# Patient Record
Sex: Female | Born: 2000
Health system: Southern US, Community
[De-identification: ages and names within clinical notes are randomized; demographics above are authoritative.]

## PROBLEM LIST (undated history)

## (undated) DIAGNOSIS — F419 Anxiety disorder, unspecified: Secondary | ICD-10-CM

## (undated) DIAGNOSIS — F32A Depression, unspecified: Secondary | ICD-10-CM

## (undated) HISTORY — DX: Depression, unspecified: F32.A

## (undated) HISTORY — DX: Anxiety disorder, unspecified: F41.9

## (undated) HISTORY — PX: WISDOM TOOTH EXTRACTION: SHX21

---
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
## 2021-01-04 ENCOUNTER — Ambulatory Visit (INDEPENDENT_AMBULATORY_CARE_PROVIDER_SITE_OTHER): Payer: 59 | Admitting: Certified Nurse Midwife

## 2021-01-04 ENCOUNTER — Other Ambulatory Visit: Payer: Self-pay

## 2021-01-04 ENCOUNTER — Encounter: Payer: Self-pay | Admitting: Certified Nurse Midwife

## 2021-01-04 VITALS — BP 116/75 | HR 57 | Ht 62.0 in | Wt 183.3 lb

## 2021-01-04 DIAGNOSIS — Z3009 Encounter for other general counseling and advice on contraception: Secondary | ICD-10-CM | POA: Diagnosis not present

## 2021-01-04 LAB — POCT URINE PREGNANCY: Preg Test, Ur: NEGATIVE

## 2021-01-04 MED ORDER — NORETHIN ACE-ETH ESTRAD-FE 1-20 MG-MCG PO TABS: 1.0000 | ORAL_TABLET | Freq: Every day | ORAL | 11 refills | Status: DC

## 2021-01-04 NOTE — Progress Notes (Signed)
NP-Present to discuss birth control. Pt stated that she would like OCP for birth control. UPT=neg.

## 2021-01-04 NOTE — Progress Notes (Signed)
Subjective:    Kelly Austin is a 20 y.o. female who presents for contraception counseling. The patient has no complaints today. The patient is sexually active. Pertinent past medical history: none.  Menstrual History: OB History   No obstetric history on file.     UPT is negative today.    The following portions of the patient's history were reviewed and updated as appropriate: allergies, current medications, past family history, past medical history, past social history, past surgical history and problem list.  Review of Systems Pertinent items are noted in HPI.   Objective:    No exam performed today, not indicated for birth control .   Assessment:    20 y.o., starting OCP (estrogen/progesterone), no contraindications.   Plan:    All questions answered.  Reviewed all forms of birth control options available including abstinence; fertility period awareness methods; over the counter/barrier methods; hormonal contraceptive medication including pill, patch, ring, injection,contraceptive implant; hormonal and nonhormonal IUDs; permanent sterilization options including vasectomy and the various tubal sterilization modalities. Risks and benefits reviewed.  Questions were answered.  Information was given to patient to review. She request to use the pill , she has used in the past and has worked well for her. She denies any contraindications to use.   Doreene Burke, CNM

## 2021-01-04 NOTE — Patient Instructions (Signed)

## 2022-04-25 ENCOUNTER — Encounter: Payer: Self-pay | Admitting: Family

## 2022-04-25 ENCOUNTER — Ambulatory Visit: Payer: 59 | Admitting: Family

## 2022-04-25 VITALS — BP 122/74 | HR 49 | Temp 98.6°F | Resp 16 | Ht 62.25 in | Wt 176.0 lb

## 2022-04-25 DIAGNOSIS — F17290 Nicotine dependence, other tobacco product, uncomplicated: Secondary | ICD-10-CM | POA: Insufficient documentation

## 2022-04-25 DIAGNOSIS — G479 Sleep disorder, unspecified: Secondary | ICD-10-CM

## 2022-04-25 DIAGNOSIS — R002 Palpitations: Secondary | ICD-10-CM | POA: Diagnosis not present

## 2022-04-25 DIAGNOSIS — F4310 Post-traumatic stress disorder, unspecified: Secondary | ICD-10-CM | POA: Diagnosis not present

## 2022-04-25 DIAGNOSIS — F410 Panic disorder [episodic paroxysmal anxiety] without agoraphobia: Secondary | ICD-10-CM | POA: Diagnosis not present

## 2022-04-25 DIAGNOSIS — F419 Anxiety disorder, unspecified: Secondary | ICD-10-CM | POA: Diagnosis not present

## 2022-04-25 DIAGNOSIS — J029 Acute pharyngitis, unspecified: Secondary | ICD-10-CM | POA: Diagnosis not present

## 2022-04-25 DIAGNOSIS — R42 Dizziness and giddiness: Secondary | ICD-10-CM | POA: Diagnosis not present

## 2022-04-25 DIAGNOSIS — F32A Depression, unspecified: Secondary | ICD-10-CM | POA: Diagnosis not present

## 2022-04-25 DIAGNOSIS — R5383 Other fatigue: Secondary | ICD-10-CM

## 2022-04-25 LAB — CBC WITH DIFFERENTIAL/PLATELET
Basophils Absolute: 0 10*3/uL (ref 0.0–0.1)
Basophils Relative: 0.4 % (ref 0.0–3.0)
Eosinophils Absolute: 0.1 10*3/uL (ref 0.0–0.7)
Eosinophils Relative: 0.9 % (ref 0.0–5.0)
HCT: 39.2 % (ref 36.0–46.0)
Hemoglobin: 12.9 g/dL (ref 12.0–15.0)
Lymphocytes Relative: 37.2 % (ref 12.0–46.0)
Lymphs Abs: 3 10*3/uL (ref 0.7–4.0)
MCHC: 32.9 g/dL (ref 30.0–36.0)
MCV: 85.6 fl (ref 78.0–100.0)
Monocytes Absolute: 0.7 10*3/uL (ref 0.1–1.0)
Monocytes Relative: 8 % (ref 3.0–12.0)
Neutro Abs: 4.4 10*3/uL (ref 1.4–7.7)
Neutrophils Relative %: 53.5 % (ref 43.0–77.0)
Platelets: 244 10*3/uL (ref 150.0–400.0)
RBC: 4.58 Mil/uL (ref 3.87–5.11)
RDW: 12.9 % (ref 11.5–14.6)
WBC: 8.2 10*3/uL (ref 4.5–10.5)

## 2022-04-25 LAB — LIPID PANEL
Cholesterol: 179 mg/dL (ref 0–200)
HDL: 43.9 mg/dL (ref >39.00)
LDL Cholesterol: 123 mg/dL — ABNORMAL HIGH (ref 0–99)
NonHDL: 135.47
Total CHOL/HDL Ratio: 4
Triglycerides: 64 mg/dL (ref 0.0–149.0)
VLDL: 12.8 mg/dL (ref 0.0–40.0)

## 2022-04-25 LAB — VITAMIN B12: Vitamin B-12: 260 pg/mL (ref 211–911)

## 2022-04-25 LAB — COMPREHENSIVE METABOLIC PANEL
ALT: 22 U/L (ref 0–35)
AST: 22 U/L (ref 0–37)
Albumin: 4.9 g/dL (ref 3.5–5.2)
Alkaline Phosphatase: 62 U/L (ref 39–117)
BUN: 11 mg/dL (ref 6–23)
CO2: 27 mEq/L (ref 19–32)
Calcium: 9.9 mg/dL (ref 8.4–10.5)
Chloride: 104 mEq/L (ref 96–112)
Creatinine, Ser: 0.85 mg/dL (ref 0.40–1.20)
GFR: 98.82 mL/min (ref >60.00)
Glucose, Bld: 94 mg/dL (ref 70–99)
Potassium: 4.4 mEq/L (ref 3.5–5.1)
Sodium: 138 mEq/L (ref 135–145)
Total Bilirubin: 0.3 mg/dL (ref 0.2–1.2)
Total Protein: 7.2 g/dL (ref 6.0–8.3)

## 2022-04-25 LAB — MONONUCLEOSIS SCREEN: Mono Screen: NEGATIVE

## 2022-04-25 LAB — TSH: TSH: 1.28 u[IU]/mL (ref 0.35–5.50)

## 2022-04-25 NOTE — Assessment & Plan Note (Signed)
ekg today, nsr  Will still refer to cardiology Consider holter monitor however pt with daily monster energy drinks, advised likely contributing to palpitations and to try to stop drinking. Limit caffeine as well

## 2022-04-25 NOTE — Progress Notes (Signed)
New Patient Office Visit  Subjective:  Patient ID: Kelly Austin, female    DOB: Aug 30, 2001  Age: 21 y.o. MRN: [id number]  CC:  Chief Complaint  Patient presents with  . Establish Care    HPI Kelly Austin is here to establish care as a new patient.  Prior provider was: Shirlean Kelly, Pediatrician  Pt is with acute concerns.   Over the last few years she does notice that she gets dizzy when she starts to work out. Last time she was exercising her face felt really tingling, she feels flush. This occurs with lifting weights and also cardio. Not as bad at home bc she isn't sure if this is also exacerbated with anxiety. Some slight chest pain. Does feel her heart racing when this occurs. In the moment with sob, more like easy exacerbation with exertion. No known h/o asthma. Does feel at the moment can not get a deep breath.   Also with a sore throat over the last one week.   chronic concerns:  Anxiety depression, PTSD, and panic disorder: tried prozac but had derealization - Does have a therapist, has appt tomorrow as well with the psychiatrist also.    Has also tried zoloft lexapro and cymbalta.   Has been vaping for five years. She is having trouble quitting however she has a lot of anxiety provoking triggers going on that make it hard for her to quit.   Does feel fatigue often. Doesn't sleep overly well, on avreage about five hours of sleep. 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 Anxiety   . Depression     Past Surgical History:  Procedure Laterality Date  . WISDOM TOOTH EXTRACTION      Family History  Problem Relation Age of Onset  . Hypertension Mother   . Alcohol abuse Father   . Cancer Father        unknown, on his hip  . COPD Father   . Healthy Brother   . Heart attack Maternal Grandmother        unsure when, maybe 60's  . Depression Maternal Grandmother   . Dementia  Maternal Grandmother   . Cancer - Lung Paternal Grandfather        smoker  . Healthy Half-Sister     Social History   Socioeconomic History  . Marital status: Single    Spouse name: Not on file  . Number of children: 0  . Years of education: Not on file  . Highest education level: Not on file  Occupational History    Comment: student and work    Comment: Educational psychologist at [employer]  Tobacco Use  . Smoking status: Never  . Smokeless tobacco: Never  Vaping Use  . Vaping Use: Every day  . Start date: 04/12/2017  . Substances: Nicotine, Flavoring  Substance and Sexual Activity  . Alcohol use: No  . Drug use: No  . Sexual activity: Yes    Partners: Male    Birth control/protection: Condom    Comment: in a relationship  Other Topics Concern  . Not on file  Social History Narrative  . Not on file   Social Determinants of Health   Financial Resource Strain: Not on file  Food Insecurity: Not on file  Transportation Needs: Not on file  Physical Activity: Not on file  Stress: Not on file  Social Connections: Not on file  Intimate Partner  Violence: Not on file    Outpatient Medications Prior to Visit  Medication Sig Dispense Refill  . hydrOXYzine (ATARAX) 25 MG tablet     . FLUoxetine (PROZAC) 10 MG tablet Take 10 mg by mouth daily.    . norethindrone-ethinyl estradiol (JUNEL FE 1/20) 1-20 MG-MCG tablet Take 1 tablet by mouth daily. 28 tablet 11  . ondansetron (ZOFRAN ODT) 4 MG disintegrating tablet Take 1 tablet (4 mg total) by mouth every 8 (eight) hours as needed for nausea or vomiting. 9 tablet 0   No facility-administered medications prior to visit.    Allergies  Allergen Reactions  . Cymbalta [Duloxetine Hcl] Other (See Comments)    Increase SI   . Lexapro [Escitalopram] Anxiety    insomnia  . Prozac [Fluoxetine] Anxiety    Derealization   . Zoloft [Sertraline] Nausea Only    Nausea and heartburn and emotional blunting        Objective:    Physical  Exam Vitals reviewed.  Constitutional:      General: She is not in acute distress.    Appearance: Normal appearance. She is obese. She is not ill-appearing, toxic-appearing or diaphoretic.  HENT:     Right Ear: Tympanic membrane normal.     Left Ear: Tympanic membrane normal.     Mouth/Throat:     Mouth: Mucous membranes are moist.     Pharynx: Posterior oropharyngeal erythema (mild) present. No pharyngeal swelling.     Tonsils: No tonsillar exudate.  Eyes:     Extraocular Movements: Extraocular movements intact.     Conjunctiva/sclera: Conjunctivae normal.     Pupils: Pupils are equal, round, and reactive to light.  Neck:     Thyroid: No thyroid mass.  Cardiovascular:     Rate and Rhythm: Normal rate and regular rhythm.  Pulmonary:     Effort: Pulmonary effort is normal.     Breath sounds: Normal breath sounds.  Musculoskeletal:        General: Normal range of motion.  Lymphadenopathy:     Cervical:     Right cervical: No superficial cervical adenopathy.    Left cervical: No superficial cervical adenopathy.  Skin:    General: Skin is warm.     Capillary Refill: Capillary refill takes less than 2 seconds.  Neurological:     General: No focal deficit present.     Mental Status: She is alert and oriented to person, place, and time.  Psychiatric:        Mood and Affect: Mood normal.        Behavior: Behavior normal.        Thought Content: Thought content normal.        Judgment: Judgment normal.      BP 122/74   Pulse (!) 49   Temp 98.6 F (37 C)   Resp 16   Ht 5' 2.25" (1.581 m)   Wt 176 lb (79.8 kg)   LMP 04/05/2022 (Exact Date)   SpO2 98%   BMI 31.93 kg/m  Wt Readings from Last 3 Encounters:  04/25/22 176 lb (79.8 kg)  01/04/21 183 lb 4.8 oz (83.1 kg) (96 %, Z= 1.70)*  02/20/18 150 lb 2.1 oz (68.1 kg) (88 %, Z= 1.16)*   * Growth percentiles are based on CDC (Girls, 2-20 Years) data.     Health Maintenance Due  Topic Date Due  . COVID-19 Vaccine (1)  Never done  . HPV VACCINES (1 - 2-dose series) Never done  . CHLAMYDIA SCREENING  Never done  . HIV Screening  Never done  . Hepatitis C Screening  Never done  . TETANUS/TDAP  Never done       Topic Date Due  . HPV VACCINES (1 - 2-dose series) Never done    Lab Results  Component Value Date   TSH 1.28 04/25/2022   Lab Results  Component Value Date   WBC 8.2 04/25/2022   HGB 12.9 04/25/2022   HCT 39.2 04/25/2022   MCV 85.6 04/25/2022   PLT 244.0 04/25/2022   Lab Results  Component Value Date   NA 138 04/25/2022   K 4.4 04/25/2022   CO2 27 04/25/2022   GLUCOSE 94 04/25/2022   BUN 11 04/25/2022   CREATININE 0.85 04/25/2022   BILITOT 0.3 04/25/2022   ALKPHOS 62 04/25/2022   AST 22 04/25/2022   ALT 22 04/25/2022   PROT 7.2 04/25/2022   ALBUMIN 4.9 04/25/2022   CALCIUM 9.9 04/25/2022   ANIONGAP 7 02/20/2018   GFR 98.82 04/25/2022   Lab Results  Component Value Date   CHOL 179 04/25/2022   Lab Results  Component Value Date   HDL 43.90 04/25/2022   Lab Results  Component Value Date   LDLCALC 123 (H) 04/25/2022   Lab Results  Component Value Date   TRIG 64.0 04/25/2022   Lab Results  Component Value Date   CHOLHDL 4 04/25/2022   No results found for: "HGBA1C"    Assessment & Plan:   Problem List Items Addressed This Visit       Other   Panic attack    hydroxyxine prn       Relevant Medications   hydrOXYzine (ATARAX) 25 MG tablet   Anxiety and depression - Primary    F/u with psychology and psychiatry as scheduled for tomorrow Let's work on managing anxiety to help with possible cessation of vaping.  Could consider wellbutrin with psychiatry if they think this might be helpful option       Relevant Medications   hydrOXYzine (ATARAX) 25 MG tablet   Other Relevant Orders   Comprehensive metabolic panel (Completed)   Lipid panel (Completed)   CBC with Differential/Platelet (Completed)   TSH (Completed)   PTSD (post-traumatic stress  disorder)   Relevant Medications   hydrOXYzine (ATARAX) 25 MG tablet   Sore throat    Strep tested in office, negative Likely more r/t allergies and pnd Warm salt water gargles  Will also order mono testing       Relevant Orders   POCT rapid strep A   Mononucleosis screen (Completed)   Palpitations    ekg today, nsr  Will still refer to cardiology Consider holter monitor however pt with daily monster energy drinks, advised likely contributing to palpitations and to try to stop drinking. Limit caffeine as well      Relevant Orders   EKG 12-Lead (Completed)   Ambulatory referral to Cardiology   Dizziness and giddiness    ekg today in office Increase water intake      Relevant Medications   hydrOXYzine (ATARAX) 25 MG tablet   Other Relevant Orders   EKG 12-Lead (Completed)   Ambulatory referral to Cardiology   Other fatigue    Fatigue lab work up ordered and pending      Relevant Orders   Comprehensive metabolic panel (Completed)   Lipid panel (Completed)   CBC with Differential/Platelet (Completed)   TSH (Completed)   Vitamin B12 (Completed)   Ambulatory referral to Cardiology   Sleep  disorder    Work on sleep techniques, handout sent to [employer]       Relevant Orders   Comprehensive metabolic panel (Completed)   Lipid panel (Completed)   CBC with Differential/Platelet (Completed)   TSH (Completed)   Vaping nicotine dependence, tobacco product    Discussed cessation options nicorette gum /nicotine patch  Also recommended pt get control of anxiety/depression with psychiatry and psychology as this may also help        No orders of the defined types were placed in this encounter.   Follow-up: Return in about 3 months (around 07/26/2022) for regular follow up .    Mort Sawyers, FNP

## 2022-04-25 NOTE — Assessment & Plan Note (Signed)
Fatigue lab work up ordered and pending

## 2022-04-25 NOTE — Assessment & Plan Note (Signed)
hydroxyxine prn

## 2022-04-25 NOTE — Assessment & Plan Note (Signed)
ekg today in office Increase water intake

## 2022-04-25 NOTE — Assessment & Plan Note (Addendum)
Strep tested in office, negative Likely more r/t allergies and pnd Warm salt water gargles  Will also order mono testing

## 2022-04-25 NOTE — Assessment & Plan Note (Signed)
F/u with psychology and psychiatry as scheduled for tomorrow Let's work on managing anxiety to help with possible cessation of vaping.  Could consider wellbutrin with psychiatry if they think this might be helpful option

## 2022-04-25 NOTE — Assessment & Plan Note (Signed)
Work on sleep techniques, handout sent to [employer]

## 2022-04-25 NOTE — Patient Instructions (Addendum)
Welcome to our clinic, I am happy to have you as my new patient. I am excited to continue on this healthcare journey with you.  Start taking zyrtec over the counter.   Recommend stopping energy drinks as this may be causing your symptoms.   A referral was placed today for cardiology Please let us know if you have not heard back within 2 weeks about the referral.  Stop by the lab prior to leaving today. I will notify you of your results once received.   Please keep in mind Any my chart messages you send have up to a three business day turnaround for a response.  Phone calls may take up to a one full business day turnaround for a  response.   If you need a medication refill I recommend you request it through the pharmacy as this is easiest for [place] rather than sending a message and or phone call.   Due to recent changes in healthcare laws, you may see results of your imaging and/or laboratory studies on MyChart before I have had a chance to review them.  I understand that in some cases there may be results that are confusing or concerning to you. Please understand that not all results are received at the same time and often I may need to interpret multiple results in order to provide you with the best plan of care or course of treatment. Therefore, I ask that you please give me 2 business days to thoroughly review all your results before contacting my office for clarification. Should we see a critical lab result, you will be contacted sooner.   It was a pleasure seeing you today! Please do not hesitate to reach out with any questions and or concerns.  Regards,   Mort Sawyers FNP-C

## 2022-04-25 NOTE — Assessment & Plan Note (Signed)
Discussed cessation options nicorette gum /nicotine patch  Also recommended pt get control of anxiety/depression with psychiatry and psychology as this may also help

## 2022-06-28 ENCOUNTER — Ambulatory Visit: Payer: 59 | Attending: Cardiovascular Disease | Admitting: Cardiovascular Disease

## 2022-06-28 ENCOUNTER — Encounter: Payer: Self-pay | Admitting: Cardiovascular Disease

## 2022-06-28 VITALS — BP 119/76 | HR 83 | Ht 62.0 in | Wt 177.4 lb

## 2022-06-28 DIAGNOSIS — R42 Dizziness and giddiness: Secondary | ICD-10-CM | POA: Diagnosis not present

## 2022-06-28 DIAGNOSIS — R079 Chest pain, unspecified: Secondary | ICD-10-CM

## 2022-06-28 DIAGNOSIS — R0602 Shortness of breath: Secondary | ICD-10-CM | POA: Diagnosis not present

## 2022-06-28 NOTE — Progress Notes (Signed)
Cardiology Office Note   Date:  06/28/2022   ID:  Kelly Austin, DOB Jun 13, 2001, MRN [id number]  PCP:  Mort Sawyers, FNP  Cardiologist:   Lorine Bears, MD   Chief Complaint  Patient presents with   Other    Dizziness. Meds reviewed verbally with pt.      History of Present Illness: Kelly Austin is a 21 y.o. female who was referred by Mort Sawyers for evaluation of dizziness.  The patient was told that she was born with a heart murmur which has subsequently resolved.  She has history of anxiety and no other medical conditions.  She started working out recently and had episodes of dizziness mainly with weight lifting.  She did not have the symptoms with aerobic exercises.  She does report intermittent chest pain and shortness of breath.  No syncope or presyncope.  She used to vape but she stopped recently.  She does not smoke cigarettes.  No alcohol or drug use. There is no family history of premature coronary artery disease or arrhythmia.    Past Medical History:  Diagnosis Date   Anxiety    Depression     Past Surgical History:  Procedure Laterality Date   WISDOM TOOTH EXTRACTION       Current Outpatient Medications  Medication Sig Dispense Refill   cyanocobalamin (VITAMIN B12) 1000 MCG tablet Take 1,000 mcg by mouth daily.     hydrOXYzine (ATARAX) 25 MG tablet Take 25 mg by mouth as needed.     No current facility-administered medications for this visit.    Allergies:   Cymbalta [duloxetine hcl], Lexapro [escitalopram], Prozac [fluoxetine], and Zoloft [sertraline]    Social History:  The patient  reports that she has never smoked. She has never used smokeless tobacco. She reports that she does not drink alcohol and does not use drugs.   Family History:  The patient's family history includes Alcohol abuse in her father; COPD in her father; Cancer in her father; Cancer - Lung in her paternal grandfather; Dementia in her maternal grandmother;  Depression in her maternal grandmother; Healthy in her brother and half-sister; Heart attack in her maternal grandmother; Hypertension in her mother.    ROS:  Please see the history of present illness.   Otherwise, review of systems are positive for none.   All other systems are reviewed and negative.    PHYSICAL EXAM: VS:  BP 119/76 (BP Location: Right Arm, Patient Position: Sitting, Cuff Size: Large)   Pulse 83   Ht 5\' 2"  (1.575 m)   Wt 177 lb 6 oz (80.5 kg)   SpO2 93%   BMI 32.44 kg/m  , BMI Body mass index is 32.44 kg/m. GEN: Well nourished, well developed, in no acute distress  HEENT: normal  Neck: no JVD, carotid bruits, or masses Cardiac: RRR; no murmurs, rubs, or gallops,no edema  Respiratory:  clear to auscultation bilaterally, normal work of breathing GI: soft, nontender, nondistended, + BS MS: no deformity or atrophy  Skin: warm and dry, no rash Neuro:  Strength and sensation are intact Psych: euthymic mood, full affect   EKG:  EKG is ordered today. The ekg ordered today demonstrates normal sinus rhythm with nonspecific T wave changes.   Recent Labs: 04/25/2022: ALT 22; BUN 11; Creatinine, Ser 0.85; Hemoglobin 12.9; Platelets 244.0; Potassium 4.4; Sodium 138; TSH 1.28    Lipid Panel    Component Value Date/Time   CHOL 179 04/25/2022 1131   TRIG 64.0 04/25/2022 1131  HDL 43.90 04/25/2022 1131   CHOLHDL 4 04/25/2022 1131   VLDL 12.8 04/25/2022 1131   LDLCALC 123 (H) 04/25/2022 1131      Wt Readings from Last 3 Encounters:  06/28/22 177 lb 6 oz (80.5 kg)  04/25/22 176 lb (79.8 kg)  01/04/21 183 lb 4.8 oz (83.1 kg) (96 %, Z= 1.70)*   * Growth percentiles are based on CDC (Girls, 2-20 Years) data.          06/28/2022    2:16 PM  PAD Screen  Previous PAD dx? No  Previous surgical procedure? No  Pain with walking? No  Feet/toe relief with dangling? No  Painful, non-healing ulcers? No  Extremities discolored? No      ASSESSMENT AND PLAN:  1.   Dizziness: It was mainly with weightlifting exercises but not with aerobic exercises.  Likely a vagal reaction.  No strong evidence of arrhythmia.  She is not orthostatic today and no significant change in heart rate with change in position.  Thus, no evidence of POTS.  She was reassured. Given her symptoms of intermittent chest pain, shortness of breath and dizziness with exercise, I requested an echocardiogram to ensure no structural heart abnormalities especially with her reported history of heart murmur when she was born.  Otherwise, I do not recommend further work-up.    Disposition:   FU with me as needed  Signed,  Kathlyn Sacramento, MD  06/28/2022 2:29 PM    [place]

## 2022-06-28 NOTE — Patient Instructions (Addendum)
Medication Instructions:  Your physician recommends that you continue on your current medications as directed. Please refer to the Current Medication list given to you today.  *If you need a refill on your cardiac medications before your next appointment, please call your pharmacy*   Lab Work: None ordered If you have labs (blood work) drawn today and your tests are completely normal, you will receive your results only by: MyChart Message (if you have MyChart) OR A paper copy in the mail If you have any lab test that is abnormal or we need to change your treatment, we will call you to review the results.   Testing/Procedures: Your physician has requested that you have an echocardiogram. Echocardiography is a painless test that uses sound waves to create images of your heart. It provides your doctor with information about the size and shape of your heart and how well your heart's chambers and valves are working. This procedure takes approximately one hour. There are no restrictions for this procedure.    Follow-Up: At [facility], you and your health needs are our priority.  As part of our continuing mission to provide you with exceptional heart care, we have created designated Provider Care Teams.  These Care Teams include your primary Cardiologist (physician) and Advanced Practice Providers (APPs -  Physician Assistants and Nurse Practitioners) who all work together to provide you with the care you need, when you need it.  We recommend signing up for the patient portal called "MyChart".  Sign up information is provided on this After Visit Summary.  MyChart is used to connect with patients for Virtual Visits (Telemedicine).  Patients are able to view lab/test results, encounter notes, upcoming appointments, etc.  Non-urgent messages can be sent to your provider as well.   To learn more about what you can do with MyChart, go to [email or web address].    Your next appointment:    As needed  The format for your next appointment:   In Person  Provider:   You may see Lorine Bears, MD or one of the following Advanced Practice Providers on your designated Care Team:   Nicolasa Ducking, NP Eula Listen, PA-C Cadence Fransico Michael, PA-C Charlsie Quest, NP   Other Instructions  Echocardiogram An echocardiogram is a test that uses sound waves (ultrasound) to produce images of the heart. Images from an echocardiogram can provide important information about: Heart size and shape. The size and thickness and movement of your heart's walls. Heart muscle function and strength. Heart valve function or if you have stenosis. Stenosis is when the heart valves are too narrow. If blood is flowing backward through the heart valves (regurgitation). A tumor or infectious growth around the heart valves. Areas of heart muscle that are not working well because of poor blood flow or injury from a heart attack. Aneurysm detection. An aneurysm is a weak or damaged part of an artery wall. The wall bulges out from the normal force of blood pumping through the body. Tell a health care provider about: Any allergies you have. All medicines you are taking, including vitamins, herbs, eye drops, creams, and over-the-counter medicines. Any blood disorders you have. Any surgeries you have had. Any medical conditions you have. Whether you are pregnant or may be pregnant. What are the risks? Generally, this is a safe test. However, problems may occur, including an allergic reaction to dye (contrast) that may be used during the test. What happens before the test? No specific preparation is needed. You may eat  and drink normally. What happens during the test?  You will take off your clothes from the waist up and put on a hospital gown. Electrodes or electrocardiogram (ECG)patches may be placed on your chest. The electrodes or patches are then connected to a device that monitors your heart rate and  rhythm. You will lie down on a table for an ultrasound exam. A gel will be applied to your chest to help sound waves pass through your skin. A handheld device, called a transducer, will be pressed against your chest and moved over your heart. The transducer produces sound waves that travel to your heart and bounce back (or "echo" back) to the transducer. These sound waves will be captured in real-time and changed into images of your heart that can be viewed on a video monitor. The images will be recorded on a computer and reviewed by your health care provider. You may be asked to change positions or hold your breath for a short time. This makes it easier to get different views or better views of your heart. In some cases, you may receive a image enhancer through an IV in one of your veins. This can improve the quality of the pictures from your heart. The procedure may vary among health care providers and hospitals. What can I expect after the test? You may return to your normal, everyday life, including diet, activities, and medicines, unless your health care provider tells you not to do that. Follow these instructions at home: It is up to you to get the results of your test. Ask your health care provider, or the department that is doing the test, when your results will be ready. Keep all follow-up visits. This is important. Summary An echocardiogram is a test that uses sound waves (ultrasound) to produce images of the heart. Images from an echocardiogram can provide important information about the size and shape of your heart, heart muscle function, heart valve function, and other possible heart problems. You do not need to do anything to prepare before this test. You may eat and drink normally. After the echocardiogram is completed, you may return to your normal, everyday life, unless your health care provider tells you not to do that. This information is not intended to replace advice given to you  by your health care provider. Make sure you discuss any questions you have with your health care provider. Document Revised: 06/02/2021 Document Reviewed: 05/12/2020 Elsevier Patient Education  [place].

## 2022-07-29 ENCOUNTER — Ambulatory Visit: Payer: 59 | Admitting: Family

## 2022-08-03 ENCOUNTER — Ambulatory Visit: Payer: 59 | Attending: Cardiovascular Disease

## 2022-08-03 DIAGNOSIS — R0602 Shortness of breath: Secondary | ICD-10-CM

## 2022-08-03 DIAGNOSIS — R079 Chest pain, unspecified: Secondary | ICD-10-CM

## 2022-08-03 LAB — ECHOCARDIOGRAM COMPLETE
AR max vel: 3.33 cm2
AV Area VTI: 2.97 cm2
AV Area mean vel: 2.96 cm2
AV Mean grad: 3 mmHg
AV Peak grad: 5.6 mmHg
Ao pk vel: 1.18 m/s
Area-P 1/2: 3.74 cm2
Calc EF: 73.4 %
S' Lateral: 2.8 cm
Single Plane A2C EF: 77.3 %
Single Plane A4C EF: 70.9 %

## 2022-08-12 ENCOUNTER — Ambulatory Visit: Payer: 59 | Admitting: Family

## 2023-10-23 ENCOUNTER — Ambulatory Visit: Payer: 59 | Admitting: Family

## 2023-10-23 VITALS — BP 142/80 | HR 83 | Temp 97.5°F | Ht 62.0 in | Wt 224.2 lb

## 2023-10-23 DIAGNOSIS — R309 Painful micturition, unspecified: Secondary | ICD-10-CM

## 2023-10-23 DIAGNOSIS — R350 Frequency of micturition: Secondary | ICD-10-CM

## 2023-10-23 DIAGNOSIS — N3000 Acute cystitis without hematuria: Secondary | ICD-10-CM | POA: Diagnosis not present

## 2023-10-23 LAB — POCT URINE DIPSTICK
Bilirubin, UA: NEGATIVE
Blood, UA: NEGATIVE
Glucose, UA: 100 mg/dL — AB
Ketones, POC UA: NEGATIVE mg/dL
Nitrite, UA: POSITIVE — AB
Spec Grav, UA: 1.01 (ref 1.010–1.025)
Urobilinogen, UA: 1 U/dL — AB
pH, UA: 8.5 — AB (ref 5.0–8.0)

## 2023-10-23 MED ORDER — NITROFURANTOIN MONOHYD MACRO 100 MG PO CAPS: 100.0000 mg | ORAL_CAPSULE | Freq: Two times a day (BID) | ORAL | 0 refills | Status: AC

## 2023-10-23 NOTE — Progress Notes (Unsigned)
   Established Patient Office Visit  Subjective:   Patient ID: Kelly Austin, female    DOB: 20-May-2001  Age: 23 y.o. MRN: [id number]  CC:  Chief Complaint  Patient presents with   Urinary Tract Infection    HPI: Kelly Austin is a 23 y.o. female presenting on 10/23/2023 for Urinary Tract Infection  A few weeks ago started with dysuria, frequency and pelvic discomfort. No back pain. No fever or chills.   No vaginal discharge.  No chance of STD  Has increased her water intake.   No constipation or diarrhea.         ROS: Negative unless specifically indicated above in HPI.   Relevant past medical history reviewed and updated as indicated.   Allergies and medications reviewed and updated.   Current Outpatient Medications:    cyanocobalamin (VITAMIN B12) 1000 MCG tablet, Take 1,000 mcg by mouth daily., Disp: , Rfl:    hydrOXYzine (ATARAX) 25 MG tablet, Take 25 mg by mouth as needed., Disp: , Rfl:   Allergies  Allergen Reactions   Cymbalta [Duloxetine Hcl] Other (See Comments)    Increase SI    Lexapro [Escitalopram] Anxiety    insomnia   Prozac [Fluoxetine] Anxiety    Derealization    Zoloft [Sertraline] Nausea Only    Nausea and heartburn and emotional blunting    Objective:   BP (!) 142/80 (BP Location: Left Arm, Patient Position: Sitting, Cuff Size: Large)   Pulse 83   Temp (!) 97.5 F (36.4 C) (Temporal)   Ht 5\' 2"  (1.575 m)   Wt 224 lb 3.2 oz (101.7 kg)   SpO2 95%   BMI 41.01 kg/m    Physical Exam  Assessment & Plan:  Urinary frequency -     POCT URINE DIPSTICK -     Urinalysis w microscopic + reflex cultur  Pain with urination -     POCT URINE DIPSTICK -     Urinalysis w microscopic + reflex cultur     Follow up plan: No follow-ups on file.  Mort Sawyers, FNP

## 2023-10-24 ENCOUNTER — Encounter: Payer: Self-pay | Admitting: Family

## 2023-10-24 DIAGNOSIS — N3000 Acute cystitis without hematuria: Secondary | ICD-10-CM | POA: Insufficient documentation

## 2023-10-24 NOTE — Assessment & Plan Note (Signed)
poct urine dip in office Urine culture ordered pending results antbx sent to pharmacy, pt to take as directed. Encouraged increased water intake throughout the day. Choosing to treat due to being symptomatic. If no improvement in the next 2 days pt advised to let me know.  rx nitrofurantoin 100 mg po bix x 7 days  

## 2023-10-25 LAB — URINALYSIS W MICROSCOPIC + REFLEX CULTURE
Bilirubin Urine: NEGATIVE
Glucose, UA: NEGATIVE
Hgb urine dipstick: NEGATIVE
Hyaline Cast: NONE SEEN /LPF
Ketones, ur: NEGATIVE
Leukocyte Esterase: NEGATIVE
Nitrites, Initial: POSITIVE — AB
Protein, ur: NEGATIVE
RBC / HPF: NONE SEEN /HPF (ref 0–2)
Specific Gravity, Urine: 1.018 (ref 1.001–1.035)
pH: 8.5 — ABNORMAL HIGH (ref 5.0–8.0)

## 2023-10-25 LAB — URINE CULTURE
MICRO NUMBER:: 15980246
SPECIMEN QUALITY:: ADEQUATE

## 2023-10-25 LAB — CULTURE INDICATED

## 2024-04-24 ENCOUNTER — Ambulatory Visit: Attending: Family

## 2024-04-24 ENCOUNTER — Ambulatory Visit: Admitting: Family

## 2024-04-24 ENCOUNTER — Encounter: Payer: Self-pay | Admitting: Family

## 2024-04-24 ENCOUNTER — Ambulatory Visit: Payer: Self-pay | Admitting: Family

## 2024-04-24 VITALS — BP 114/68 | HR 60 | Temp 98.3°F | Ht 62.0 in | Wt 226.4 lb

## 2024-04-24 DIAGNOSIS — R82998 Other abnormal findings in urine: Secondary | ICD-10-CM

## 2024-04-24 DIAGNOSIS — Z6841 Body Mass Index (BMI) 40.0 and over, adult: Secondary | ICD-10-CM | POA: Insufficient documentation

## 2024-04-24 DIAGNOSIS — R12 Heartburn: Secondary | ICD-10-CM

## 2024-04-24 DIAGNOSIS — N83202 Unspecified ovarian cyst, left side: Secondary | ICD-10-CM

## 2024-04-24 DIAGNOSIS — E538 Deficiency of other specified B group vitamins: Secondary | ICD-10-CM

## 2024-04-24 DIAGNOSIS — R001 Bradycardia, unspecified: Secondary | ICD-10-CM

## 2024-04-24 DIAGNOSIS — R1013 Epigastric pain: Secondary | ICD-10-CM

## 2024-04-24 DIAGNOSIS — R110 Nausea: Secondary | ICD-10-CM

## 2024-04-24 DIAGNOSIS — R0683 Snoring: Secondary | ICD-10-CM | POA: Diagnosis not present

## 2024-04-24 DIAGNOSIS — R1010 Upper abdominal pain, unspecified: Secondary | ICD-10-CM | POA: Diagnosis not present

## 2024-04-24 DIAGNOSIS — R300 Dysuria: Secondary | ICD-10-CM

## 2024-04-24 DIAGNOSIS — R1032 Left lower quadrant pain: Secondary | ICD-10-CM

## 2024-04-24 DIAGNOSIS — G4719 Other hypersomnia: Secondary | ICD-10-CM

## 2024-04-24 DIAGNOSIS — Q51810 Arcuate uterus: Secondary | ICD-10-CM

## 2024-04-24 LAB — CBC
HCT: 37 % (ref 36.0–46.0)
Hemoglobin: 12 g/dL (ref 12.0–15.0)
MCHC: 32.4 g/dL (ref 30.0–36.0)
MCV: 80.8 fl (ref 78.0–100.0)
Platelets: 292 K/uL (ref 150.0–400.0)
RBC: 4.58 Mil/uL (ref 3.87–5.11)
RDW: 14.2 % (ref 11.5–15.5)
WBC: 10.1 K/uL (ref 4.0–10.5)

## 2024-04-24 LAB — COMPREHENSIVE METABOLIC PANEL WITH GFR
ALT: 21 U/L (ref 0–35)
AST: 25 U/L (ref 0–37)
Albumin: 4.3 g/dL (ref 3.5–5.2)
Alkaline Phosphatase: 87 U/L (ref 39–117)
BUN: 13 mg/dL (ref 6–23)
CO2: 25 meq/L (ref 19–32)
Calcium: 9.6 mg/dL (ref 8.4–10.5)
Chloride: 106 meq/L (ref 96–112)
Creatinine, Ser: 0.84 mg/dL (ref 0.40–1.20)
GFR: 98.84 mL/min (ref >60.00)
Glucose, Bld: 94 mg/dL (ref 70–99)
Potassium: 4.9 meq/L (ref 3.5–5.1)
Sodium: 137 meq/L (ref 135–145)
Total Bilirubin: 0.4 mg/dL (ref 0.2–1.2)
Total Protein: 6.9 g/dL (ref 6.0–8.3)

## 2024-04-24 LAB — POCT URINE PREGNANCY: Preg Test, Ur: NEGATIVE

## 2024-04-24 LAB — T4, FREE: Free T4: 0.92 ng/dL (ref 0.60–1.60)

## 2024-04-24 LAB — T3, FREE: T3, Free: 3.5 pg/mL (ref 2.3–4.2)

## 2024-04-24 LAB — TSH: TSH: 0.93 u[IU]/mL (ref 0.35–5.50)

## 2024-04-24 LAB — VITAMIN B12: Vitamin B-12: 278 pg/mL (ref 211–911)

## 2024-04-24 MED ORDER — OMEPRAZOLE 20 MG PO CPDR: 20.0000 mg | DELAYED_RELEASE_CAPSULE | Freq: Every day | ORAL | 0 refills | Status: DC

## 2024-04-24 NOTE — Assessment & Plan Note (Addendum)
 Pt has upcoming period suspect left ovarian cyst as she is days from onset of period however will monitor, if persistent will order transvaginal u/s

## 2024-04-24 NOTE — Assessment & Plan Note (Signed)
 Ongoing with snoring some symptoms of apnea.  Referral for sleep study to r/o OSA  pending

## 2024-04-24 NOTE — Assessment & Plan Note (Signed)
 Trial omeprazole  20 mg x 2 weeks Try to decrease and or avoid spicy foods, fried fatty foods, and also caffeine and chocolate as these can increase heartburn symptoms.  H pylori and alpha gal ordered to r/o, pending

## 2024-04-24 NOTE — Assessment & Plan Note (Signed)
 Pt advised to work on diet and exercise as tolerated

## 2024-04-24 NOTE — Progress Notes (Signed)
 noted

## 2024-04-24 NOTE — Assessment & Plan Note (Signed)
 EKG in office rate 67 bpm  Sinus bradycardia  Zio monitor ordered Asymptomatic  Pending results.

## 2024-04-24 NOTE — Progress Notes (Signed)
 Established Patient Office Visit  Subjective:   Patient ID: ALEENAH HOMEN, female    DOB: Mar 16, 2001  Age: 23 y.o. MRN: [id number]  CC:  Chief Complaint  Patient presents with   Medical Management of Chronic Issues    HPI: AMAYIAH GOSNELL is a 23 y.o. female presenting on 04/24/2024 for Medical Management of Chronic Issues  For the last few months she has pain after eating food. She has noticed anything that has a lot of gluten or unhealthy things there will be increased pain. She hasn't noticed any change in bowel movements. The pain is typically the upper abdomen and will burn at times, with at times heartburn. She might have some issues with beef but she hasn't noticed 100%. No blood in stool. Nausea but not throwing up.   Pain only after eating typically most time with an empty stomach does not hurt but she will be nauseous especially in the am. No change of pregnancy. LMP June 30th.   Has noticed on her apple watch her resting heart rate is around 40/50 bpm. She does feel a lot of fatigue. No chest pain, no increased sob or doe. Does not feel like he heart skips a beat. She does snore at time, she is tried throughout the day, has not noticed headaches in the am.          ROS: Negative unless specifically indicated above in HPI.   Relevant past medical history reviewed and updated as indicated.   Allergies and medications reviewed and updated.   Current Outpatient Medications:    hydrOXYzine (ATARAX) 25 MG tablet, Take 25 mg by mouth as needed., Disp: , Rfl:    omeprazole  (PRILOSEC) 20 MG capsule, Take 1 capsule (20 mg total) by mouth daily., Disp: 30 capsule, Rfl: 0  Allergies  Allergen Reactions   Cymbalta [Duloxetine Hcl] Other (See Comments)    Increase SI    Lexapro [Escitalopram] Anxiety    insomnia   Prozac [Fluoxetine] Anxiety    Derealization    Zoloft [Sertraline] Nausea Only    Nausea and heartburn and emotional blunting    Objective:   BP  114/68 (BP Location: Right Arm, Patient Position: Sitting, Cuff Size: Large)   Pulse 60 Comment: manual  Temp 98.3 F (36.8 C) (Temporal)   Ht 5' 2 (1.575 m)   Wt 226 lb 6.4 oz (102.7 kg)   LMP 04/01/2024 (Exact Date)   SpO2 100%   BMI 41.41 kg/m    Physical Exam Vitals reviewed.  Constitutional:      General: She is not in acute distress.    Appearance: Normal appearance. She is obese. She is not ill-appearing, toxic-appearing or diaphoretic.  HENT:     Head: Normocephalic.  Cardiovascular:     Rate and Rhythm: Bradycardia present.  Pulmonary:     Effort: Pulmonary effort is normal.     Breath sounds: Normal breath sounds.  Abdominal:     General: Abdomen is flat. Bowel sounds are normal.     Palpations: Abdomen is soft.     Tenderness: There is abdominal tenderness in the epigastric area and left lower quadrant. There is no guarding or rebound.  Musculoskeletal:        General: Normal range of motion.     Right lower leg: No edema.     Left lower leg: No edema.  Neurological:     General: No focal deficit present.     Mental Status: She is alert and oriented  to person, place, and time. Mental status is at baseline.  Psychiatric:        Mood and Affect: Mood normal.        Behavior: Behavior normal.        Thought Content: Thought content normal.        Judgment: Judgment normal.     Assessment & Plan:  Heartburn Assessment & Plan: Trial omeprazole  20 mg x 2 weeks Try to decrease and or avoid spicy foods, fried fatty foods, and also caffeine and chocolate as these can increase heartburn symptoms.  H pylori and alpha gal ordered to r/o, pending  Orders: -     Omeprazole ; Take 1 capsule (20 mg total) by mouth daily.  Dispense: 30 capsule; Refill: 0 -     Alpha-Gal Panel -     CBC -     Comprehensive metabolic panel with GFR  Snoring -     Ambulatory referral to Sleep Studies  Excessive daytime sleepiness Assessment & Plan: Ongoing with snoring some symptoms  of apnea.  Referral for sleep study to r/o OSA  pending  Orders: -     Ambulatory referral to Sleep Studies -     TSH -     T3, free -     T4, free  BMI 40.0-44.9, adult (HCC) Assessment & Plan: Pt advised to work on diet and exercise as tolerated   Orders: -     EKG 12-Lead  Epigastric pain -     Alpha-Gal Panel -     CBC -     Comprehensive metabolic panel with GFR  Upper abdominal pain -     Comprehensive metabolic panel with GFR  Bradycardia -     TSH -     T3, free -     T4, free  Dysuria -     Urinalysis w microscopic + reflex cultur  Nausea -     POCT urine pregnancy -     Urinalysis w microscopic + reflex cultur  Left lower quadrant abdominal pain Assessment & Plan: Pt has upcoming period suspect left ovarian cyst as she is days from onset of period however will monitor, if persistent will order transvaginal u/s   Arcuate uterus Assessment & Plan: arcuate uterus configuration incidentally seen on CT abd pelvis  Congential  Can have issues with pregnancy   Left ovarian cyst  Sinus bradycardia Assessment & Plan: EKG in office rate 67 bpm  Sinus bradycardia  Zio monitor ordered Asymptomatic  Pending results.    Orders: -     LONG TERM MONITOR (3-14 DAYS); Future  Vitamin B12 deficiency -     Vitamin B12  Other orders -     Interpretation: -     Urine Culture -     REFLEXIVE URINE CULTURE     Follow up plan: Return in about 6 months (around 10/25/2024) for f/u CPE.  Ginger Patrick, FNP

## 2024-04-24 NOTE — Assessment & Plan Note (Signed)
 arcuate uterus configuration incidentally seen on CT abd pelvis  Congential  Can have issues with pregnancy

## 2024-04-28 LAB — URINE CULTURE
MICRO NUMBER:: 16737365
SPECIMEN QUALITY:: ADEQUATE

## 2024-04-28 LAB — URINALYSIS W MICROSCOPIC + REFLEX CULTURE
Bacteria, UA: NONE SEEN /HPF
Bilirubin Urine: NEGATIVE
Glucose, UA: NEGATIVE
Hgb urine dipstick: NEGATIVE
Hyaline Cast: NONE SEEN /LPF
Ketones, ur: NEGATIVE
Nitrites, Initial: NEGATIVE
Protein, ur: NEGATIVE
RBC / HPF: NONE SEEN /HPF (ref 0–2)
Specific Gravity, Urine: 1.023 (ref 1.001–1.035)
pH: 5.5 (ref 5.0–8.0)

## 2024-04-28 LAB — ALPHA-GAL PANEL
Allergen, Mutton, f88: <0.1 kU/L
Allergen, Pork, f26: <0.1 kU/L
Beef: <0.1 kU/L
CLASS: 0
CLASS: 0
Class: 0
GALACTOSE-ALPHA-1,3-GALACTOSE IGE*: <0.1 kU/L (ref <0.10)

## 2024-04-28 LAB — INTERPRETATION:

## 2024-04-28 LAB — CULTURE INDICATED

## 2024-05-24 DIAGNOSIS — R001 Bradycardia, unspecified: Secondary | ICD-10-CM

## 2024-05-30 ENCOUNTER — Encounter: Payer: Self-pay | Admitting: Family

## 2024-05-31 ENCOUNTER — Encounter: Payer: Self-pay | Admitting: *Deleted

## 2024-06-03 DIAGNOSIS — G4730 Sleep apnea, unspecified: Secondary | ICD-10-CM

## 2024-06-03 HISTORY — DX: Sleep apnea, unspecified: G47.30

## 2024-06-10 ENCOUNTER — Ambulatory Visit: Admitting: Family

## 2024-06-10 VITALS — BP 124/84 | HR 88 | Temp 98.0°F | Ht 62.0 in | Wt 229.8 lb

## 2024-06-10 DIAGNOSIS — R001 Bradycardia, unspecified: Secondary | ICD-10-CM | POA: Diagnosis not present

## 2024-06-10 DIAGNOSIS — G4733 Obstructive sleep apnea (adult) (pediatric): Secondary | ICD-10-CM | POA: Diagnosis not present

## 2024-06-10 DIAGNOSIS — R12 Heartburn: Secondary | ICD-10-CM | POA: Diagnosis not present

## 2024-06-10 DIAGNOSIS — Z6841 Body Mass Index (BMI) 40.0 and over, adult: Secondary | ICD-10-CM

## 2024-06-10 DIAGNOSIS — R42 Dizziness and giddiness: Secondary | ICD-10-CM

## 2024-06-10 DIAGNOSIS — Z79899 Other long term (current) drug therapy: Secondary | ICD-10-CM

## 2024-06-10 DIAGNOSIS — R55 Syncope and collapse: Secondary | ICD-10-CM

## 2024-06-10 MED ORDER — FAMOTIDINE 10 MG PO TABS: 10.0000 mg | ORAL_TABLET | Freq: Two times a day (BID) | ORAL | 0 refills | Status: AC

## 2024-06-10 NOTE — Progress Notes (Signed)
 Sleep study, OV note and CPAP order have been faxed to [facility].

## 2024-06-10 NOTE — Addendum Note (Signed)
 Addended by: CORWIN ANTU on: 06/10/2024 02:56 PM   Modules accepted: Orders

## 2024-06-10 NOTE — Addendum Note (Signed)
 Addended by: ALBINO SHAVER C on: 06/10/2024 10:29 AM   Modules accepted: Orders

## 2024-06-10 NOTE — Progress Notes (Signed)
 Established Patient Office Visit  Subjective:      CC:  Chief Complaint  Patient presents with   Medical Management of Chronic Issues    HPI: Kelly Austin is a 23 y.o. female presenting on 06/10/2024 for Medical Management of Chronic Issues .  Discussed the use of AI scribe software for clinical note transcription with the patient, who gave verbal consent to proceed.  History of Present Illness Kelly Austin is a 23 year old female who presents for follow-up of a sleep study.  The sleep study revealed mild obstructive sleep apnea. The patient reports occasional morning headaches, daytime fatigue, and episodes of waking up gasping for breath at night. She experiences frequent snoring, averaging 51 snores per hour. The study recorded 20 complete apneas and 44 hypopneas, with a lowest oxygen saturation of 85%.  She struggles to maintain a regular exercise routine due to lightheadedness and disorientation, which she suspects may be related to her sleep apnea. She attempts to drink 60 to 80 ounces of water daily but does not eat every two to three hours, leading to episodes of shakiness that improve after eating.  She has been experiencing worsening heartburn over the past week. She was previously taking Prilosec 20 mg daily but stopped about a week ago after completing a course. Nausea and occasional heartburn occur even on days without caffeine intake. Her caffeine consumption varies, typically involving one coffee or energy drink per day. Energy drinks, especially those with over 200 mg of caffeine, exacerbate her heartburn. No frequent burping, but significant nausea is present.  Requesting a new cardiology referral would like a second consult.  Last visit 2023 went for evaluation of dizziness.  EKG with NSR with nonspecific T wave changes.  Suspected vagal reaction per his note, and orthostatics were reassuring. No evidence of POTS. Echocardiogram was order and  was only advised to f/u as needed which was normal 08/03/22. Zio monitor was with 1 NSVT with NSR ranging heart rate from 37 to 180 average 69. She does still report fatigue and dizziness she does drink caffeine and sometimes energy drinks she has at least one to two sources of caffeine daily.         Social history:  Relevant past medical, surgical, family and social history reviewed and updated as indicated. Interim medical history since our last visit reviewed.  Allergies and medications reviewed and updated.  DATA REVIEWED: CHART IN EPIC     ROS: Negative unless specifically indicated above in HPI.    Current Outpatient Medications:    famotidine  (PEPCID ) 10 MG tablet, Take 1 tablet (10 mg total) by mouth 2 (two) times daily., Disp: 60 tablet, Rfl: 0   hydrOXYzine (ATARAX) 25 MG tablet, Take 25 mg by mouth as needed., Disp: , Rfl:         Objective:        BP 124/84 (BP Location: Left Arm, Patient Position: Sitting, Cuff Size: Large)   Pulse 88   Temp 98 F (36.7 C) (Temporal)   Ht 5' 2 (1.575 m)   Wt 229 lb 12.8 oz (104.2 kg)   LMP 06/01/2024 (Exact Date)   SpO2 98%   BMI 42.03 kg/m   Physical Exam   Wt Readings from Last 3 Encounters:  06/10/24 229 lb 12.8 oz (104.2 kg)  04/24/24 226 lb 6.4 oz (102.7 kg)  10/23/23 224 lb 3.2 oz (101.7 kg)    Physical Exam Vitals reviewed.  Constitutional:  General: She is not in acute distress.    Appearance: Normal appearance. She is normal weight. She is not ill-appearing, toxic-appearing or diaphoretic.  HENT:     Head: Normocephalic.  Cardiovascular:     Rate and Rhythm: Normal rate and regular rhythm.  Pulmonary:     Effort: Pulmonary effort is normal.  Musculoskeletal:        General: Normal range of motion.  Neurological:     General: No focal deficit present.     Mental Status: She is alert and oriented to person, place, and time. Mental status is at baseline.  Psychiatric:        Mood and  Affect: Mood normal.        Behavior: Behavior normal.        Thought Content: Thought content normal.        Judgment: Judgment normal.          Results DIAGNOSTIC Sleep study: Mild obstructive sleep apnea; 20 apneas; 44 hypopneas; lowest oxygen saturation 85%  Assessment & Plan:   Assessment and Plan Assessment & Plan Mild obstructive sleep apnea Mild obstructive sleep apnea confirmed by sleep study, with symptoms of daytime fatigue, morning headaches, and episodes of waking up gasping for air. Oxygen desaturation to 85% and snoring frequency of 51 times per hour noted. Potential contributing factors include weight and nasal obstruction. - Recommend CPAP therapy to improve sleep quality and reduce daytime fatigue. - Discuss weight management strategies to potentially alleviate symptoms. - Follow up within 90 days if CPAP therapy is initiated.  Heartburn Heartburn symptoms worsened after discontinuation of Prilosec, with nausea and occasional flare-ups. Potential dietary triggers include caffeine, chocolate, and fatty foods. Consideration of H. pylori as a potential cause of symptoms. - Recommend over-the-counter Pepcid  (famotidine ) for symptom relief. - Order H. pylori breath test to rule out infection. - Provide dietary guidance to avoid heartburn triggers. - Consider referral to gastroenterology if symptoms persist despite dietary changes and Pepcid  use.  Obesity Obesity may contribute to sleep apnea symptoms. Challenges with weight loss include fatigue and lightheadedness during exercise. Emphasized dietary modifications and regular meals to maintain blood sugar levels and prevent shakiness. - Recommend consultation with a dietitian for personalized dietary advice. - Encourage regular meals every 2-3 hours, combining carbohydrates with protein. - Advise on hydration and monitoring of caffeine intake.  Dizziness, low heart rate, palpitations: -pt requesting second opinion  per cardiology  Reviewed echo, EKG, zio monitor.  Advised pt to decrease caffeine  ' Recording duration: 11 minutes      Return in about 3 months (around 09/09/2024) for f/u cpap .     Ginger Patrick, MSN, APRN, FNP-C [facility]

## 2024-06-12 ENCOUNTER — Ambulatory Visit: Payer: Self-pay | Admitting: Family

## 2024-06-12 LAB — H. PYLORI BREATH TEST: H. pylori Breath Test: NOT DETECTED

## 2024-06-20 ENCOUNTER — Encounter: Payer: Self-pay | Admitting: Family

## 2024-07-09 NOTE — Telephone Encounter (Signed)
 Have her come in for appt since not yet assessed by cardiology and advise her you have notified the referral team for an update.

## 2024-07-11 ENCOUNTER — Ambulatory Visit: Admitting: Family

## 2024-07-11 ENCOUNTER — Telehealth: Payer: Self-pay | Admitting: Family

## 2024-07-11 VITALS — BP 124/76 | HR 81 | Temp 98.4°F | Ht 62.0 in | Wt 230.4 lb

## 2024-07-11 DIAGNOSIS — E538 Deficiency of other specified B group vitamins: Secondary | ICD-10-CM | POA: Diagnosis not present

## 2024-07-11 DIAGNOSIS — N920 Excessive and frequent menstruation with regular cycle: Secondary | ICD-10-CM | POA: Diagnosis not present

## 2024-07-11 DIAGNOSIS — R42 Dizziness and giddiness: Secondary | ICD-10-CM | POA: Diagnosis not present

## 2024-07-11 DIAGNOSIS — G4719 Other hypersomnia: Secondary | ICD-10-CM

## 2024-07-11 DIAGNOSIS — H8112 Benign paroxysmal vertigo, left ear: Secondary | ICD-10-CM

## 2024-07-11 LAB — CBC
HCT: 38.1 % (ref 36.0–46.0)
Hemoglobin: 12 g/dL (ref 12.0–15.0)
MCHC: 31.6 g/dL (ref 30.0–36.0)
MCV: 81.2 fl (ref 78.0–100.0)
Platelets: 318 K/uL (ref 150.0–400.0)
RBC: 4.69 Mil/uL (ref 3.87–5.11)
RDW: 14.2 % (ref 11.5–15.5)
WBC: 10.4 K/uL (ref 4.0–10.5)

## 2024-07-11 LAB — TSH: TSH: 0.93 u[IU]/mL (ref 0.35–5.50)

## 2024-07-11 LAB — BASIC METABOLIC PANEL WITH GFR
BUN: 14 mg/dL (ref 6–23)
CO2: 30 meq/L (ref 19–32)
Calcium: 9.8 mg/dL (ref 8.4–10.5)
Chloride: 105 meq/L (ref 96–112)
Creatinine, Ser: 0.77 mg/dL (ref 0.40–1.20)
GFR: 109.55 mL/min (ref >60.00)
Glucose, Bld: 94 mg/dL (ref 70–99)
Potassium: 4.8 meq/L (ref 3.5–5.1)
Sodium: 139 meq/L (ref 135–145)

## 2024-07-11 LAB — IBC + FERRITIN
Ferritin: 7.1 ng/mL — ABNORMAL LOW (ref 10.0–291.0)
Iron: 61 ug/dL (ref 42–145)
Saturation Ratios: 13.4 % — ABNORMAL LOW (ref 20.0–50.0)
TIBC: 456.4 ug/dL — ABNORMAL HIGH (ref 250.0–450.0)
Transferrin: 326 mg/dL (ref 212.0–360.0)

## 2024-07-11 LAB — VITAMIN B12: Vitamin B-12: 831 pg/mL (ref 211–911)

## 2024-07-11 MED ORDER — MECLIZINE HCL 12.5 MG PO TABS: ORAL_TABLET | ORAL | 0 refills | Status: AC

## 2024-07-11 NOTE — Telephone Encounter (Signed)
 Cardiology referral still pending review Can we look into this?

## 2024-07-11 NOTE — Progress Notes (Signed)
 Established Patient Office Visit  Subjective:      CC:  Chief Complaint  Patient presents with   Follow-up    Dizziness, cardiology referral was never taken care of.    HPI: Kelly Austin is a 23 y.o. female presenting on 07/11/2024 for Follow-up (Dizziness, cardiology referral was never taken care of.) .  Discussed the use of AI scribe software for clinical note transcription with the patient, who gave verbal consent to proceed.  History of Present Illness Kelly Austin is a 23 year old female with mild obstructive sleep apnea who presents with worsening dizziness and lightheadedness.  She experiences worsening dizziness and lightheadedness, particularly when standing up or standing for prolonged periods. She describes episodes where she feels like she might pass out, and on one occasion, her vision started to go black, prompting her to sit back down. These symptoms have been more pronounced in the last few weeks. No ear fullness or popping, motion sickness, or spinning sensations, but she mentions occasional headaches. Her heart sometimes feels like it is skipping beats, especially when standing up.  She has a history of mild obstructive sleep apnea. Initially, the machine caused discomfort, but she reports some improvement in her fatigue since starting its use. Despite this, she continues to experience significant daytime fatigue.  She has a history of heartburn and has previously requested a cardiology referral, which is still pending. Her last cardiology evaluation in 2023 included an EKG showing normal sinus rhythm with nonspecific T-wave changes and a Holter monitor revealing one episode of SVT with heart rates ranging from 37 to 180 bpm.  She experiences heavy menstrual periods, which are generally regular but occasionally longer than usual. She has been taking B12 supplements intermittently, as her levels were previously low. She has significantly reduced  her caffeine intake, consuming only one or two caffeinated drinks since her last visit, noting that caffeine exacerbates her dizziness.         Social history:  Relevant past medical, surgical, family and social history reviewed and updated as indicated. Interim medical history since our last visit reviewed.  Allergies and medications reviewed and updated.  DATA REVIEWED: CHART IN EPIC     ROS: Negative unless specifically indicated above in HPI.    Current Outpatient Medications:    famotidine  (PEPCID ) 10 MG tablet, Take 1 tablet (10 mg total) by mouth 2 (two) times daily., Disp: 60 tablet, Rfl: 0   hydrOXYzine (ATARAX) 25 MG tablet, Take 25 mg by mouth as needed., Disp: , Rfl:    l-methylfolate-B6-B12 (METANX) 3-35-2 MG TABS tablet, Take 1 tablet by mouth daily., Disp: , Rfl:    meclizine (ANTIVERT) 12.5 MG tablet, Take one po at bedtime prn vertigo, Disp: 30 tablet, Rfl: 0        Objective:        BP 124/76 (BP Location: Left Arm, Patient Position: Sitting, Cuff Size: Large)   Pulse 81   Temp 98.4 F (36.9 C) (Temporal)   Ht 5' 2 (1.575 m)   Wt 230 lb 6.4 oz (104.5 kg)   LMP 07/04/2024 (Exact Date)   SpO2 98%   BMI 42.14 kg/m   Physical Exam NECK: Dizziness on head turning to the left side.  Wt Readings from Last 3 Encounters:  07/11/24 230 lb 6.4 oz (104.5 kg)  06/10/24 229 lb 12.8 oz (104.2 kg)  04/24/24 226 lb 6.4 oz (102.7 kg)    Physical Exam Constitutional:  General: She is not in acute distress.    Appearance: Normal appearance. She is normal weight. She is not ill-appearing, toxic-appearing or diaphoretic.  HENT:     Head: Normocephalic.  Cardiovascular:     Rate and Rhythm: Normal rate and regular rhythm.  Pulmonary:     Effort: Pulmonary effort is normal.     Breath sounds: Normal breath sounds.  Musculoskeletal:        General: Normal range of motion.  Neurological:     General: No focal deficit present.     Mental Status:  She is alert and oriented to person, place, and time. Mental status is at baseline.  Psychiatric:        Mood and Affect: Mood normal.        Behavior: Behavior normal.        Thought Content: Thought content normal.        Judgment: Judgment normal.        Orthostatic vital signs:  Laying 118/76 pulse 66 Sitting 124/82 pulse 80  Standing 120/84 pulse 72 Results DIAGNOSTIC EKG: Normal sinus rhythm with nonspecific T-wave changes, suspected vagal reaction (01/26/2024) Echocardiogram: Normal (08/03/2022) Holter monitor: One episode of supraventricular tachycardia (SVT) with normal sinus rhythm ranging from heart rate 37 to 180 bpm, average 69 bpm  Assessment & Plan:   Assessment and Plan Assessment & Plan Benign paroxysmal positional vertigo Positional vertigo likely due to dizziness when turning head to the left and changing positions, suggestive of inner ear involvement. Differential includes cardiac causes, but vertigo is more probable. - Prescribe meclizine for vertigo, advise home trial due to potential drowsiness. - Advise to rise slowly upon standing to prevent dizziness.  Lightheadedness and presyncope Persistent lightheadedness and presyncope, especially upon standing, worsening over weeks. Differential includes anemia and POTS. Previous cardiac evaluation showed normal sinus rhythm with nonspecific T-wave changes. Orthostatic hypotension to be evaluated. Dizziness worsens around menstrual periods, raising concern for anemia. - Send stat referral message to cardiology. - Order blood work to rule out anemia. - Perform orthostatic blood pressure and heart rate measurements. -orthostatic v/s on exam with elevation of heart rate from lying to sitting, ddx to consider includes POTS  Mild obstructive sleep apnea Mild obstructive sleep apnea with some improvement in fatigue since using CPAP machine. Initial adjustment issues with CPAP noted but improving.  Fatigue Fatigue has  improved slightly with CPAP use. B12 deficiency suspected due to low levels, but inconsistent B12 supplement intake.        Return in about 1 month (around 08/11/2024) for f/u vertigo .     Ginger Patrick, MSN, APRN, FNP-C [facility]

## 2024-07-15 ENCOUNTER — Ambulatory Visit: Payer: Self-pay | Admitting: Family

## 2024-07-19 NOTE — Progress Notes (Unsigned)
  Cardiology Office Note   Date:  07/22/2024  ID:  CARRIANN HESSE, DOB [birth year], MRN [id number] PCP: Corwin Antu, FNP  [facility] Providers Cardiologist:  Caron Poser, MD     History of Present Illness AMAURA Austin is a 23 y.o. female PMH obesity who presents for further evaluation and management of dizziness and lightheadedness.  Patient presents with a 2 to 35-month history of dizziness with standing.  She was recently trialed on meclizine.  This has not helped much.  She notes that she feels lightheaded and almost every time she stands up.  No syncope.  She also has palpitations.  No high risk family history.  Recent CBC, TSH, BMP all within normal limits.  Relevant CVD History -Monitor 05/2024 mean heart rate 69 bpm, rare ectopy, 6 beat episode of NSVT.  Triggers corresponded to sinus rhythm and occasionally PACs. - TTE 08/2022 normal biventricular function and no valvular disease   ROS: Pt denies any chest discomfort, jaw pain, arm pain, palpitations, syncope, presyncope, orthopnea, PND, or LE edema.  Studies Reviewed I have independently reviewed the patient's ECG, previous cardiac testing, recent medical records.  Physical Exam VS:  BP 116/80 (BP Location: Right Arm, Patient Position: Sitting, Cuff Size: Large)   Pulse 78   Ht 5' 2 (1.575 m)   Wt 230 lb (104.3 kg)   LMP 07/04/2024 (Exact Date)   SpO2 99%   BMI 42.07 kg/m   Orthostatic VS for the past 24 hrs (Last 3 readings):  BP- Lying Pulse- Lying BP- Sitting Pulse- Sitting BP- Standing at 0 minutes Pulse- Standing at 0 minutes BP- Standing at 3 minutes Pulse- Standing at 3 minutes  07/22/24 0821 116/78 92 115/74 94 119/81 123 118/77 103      Wt Readings from Last 3 Encounters:  07/22/24 230 lb (104.3 kg)  07/11/24 230 lb 6.4 oz (104.5 kg)  06/10/24 229 lb 12.8 oz (104.2 kg)    GEN: No acute distress. NECK: No JVD; No carotid bruits. CARDIAC: RRR, no murmurs, rubs, gallops. RESPIRATORY:   Clear to auscultation. EXTREMITIES:  Warm and well-perfused. No edema.  ASSESSMENT AND PLAN Paroxysmal tachycardia Palpitations Dizziness PACs Unclear etiology.  Her orthostatic vital signs are borderline consistent with POTS with a 29 bpm increase in heart rate with standing immediately that drops down to 10 bpm after 3 minutes.  She does not quite meet diagnostic criteria. No orthostatic hypotension. Recent monitor was significant only for some occasional PACs which did sometimes correspond with triggers; no IST.  No high risk signs on history or exam.  Plan: - Obtain echocardiogram to rule out significant structural heart disease - Given occasional correlation with PVCs, will trial low-dose metoprolol 25 mg XL once per day.  We discussed stopping this medicine if it does not seem to help her symptoms after day or 2. - Given her borderline orthostatic vital signs, we can also go ahead and just do the standard conservative treatments for possible diagnosis of POTS. I have included these in the AVS.        Dispo: RTC 3 months  Signed, Caron Poser, MD

## 2024-07-22 ENCOUNTER — Encounter: Payer: Self-pay | Admitting: Family

## 2024-07-22 ENCOUNTER — Ambulatory Visit: Admitting: Family

## 2024-07-22 ENCOUNTER — Ambulatory Visit

## 2024-07-22 VITALS — BP 116/80 | HR 78 | Ht 62.0 in | Wt 230.0 lb

## 2024-07-22 VITALS — BP 114/76 | HR 83 | Temp 98.3°F | Ht 62.0 in | Wt 231.2 lb

## 2024-07-22 DIAGNOSIS — R42 Dizziness and giddiness: Secondary | ICD-10-CM

## 2024-07-22 DIAGNOSIS — D500 Iron deficiency anemia secondary to blood loss (chronic): Secondary | ICD-10-CM | POA: Diagnosis not present

## 2024-07-22 DIAGNOSIS — G4733 Obstructive sleep apnea (adult) (pediatric): Secondary | ICD-10-CM

## 2024-07-22 DIAGNOSIS — I491 Atrial premature depolarization: Secondary | ICD-10-CM

## 2024-07-22 DIAGNOSIS — H8112 Benign paroxysmal vertigo, left ear: Secondary | ICD-10-CM

## 2024-07-22 DIAGNOSIS — I479 Paroxysmal tachycardia, unspecified: Secondary | ICD-10-CM | POA: Diagnosis not present

## 2024-07-22 DIAGNOSIS — R002 Palpitations: Secondary | ICD-10-CM

## 2024-07-22 MED ORDER — METOPROLOL SUCCINATE ER 25 MG PO TB24: 25.0000 mg | ORAL_TABLET | Freq: Every day | ORAL | 3 refills | Status: AC

## 2024-07-22 NOTE — Progress Notes (Signed)
 Established Patient Office Visit  Subjective:      CC:  Chief Complaint  Patient presents with   Medical Management of Chronic Issues    Still having issues with dizziness and lightheadedness. Saw Cardiology this morning and was told that she more than likely has POTS; they ordered an echocardiogram.    HPI: Kelly Austin is a 23 y.o. female presenting on 07/22/2024 for Medical Management of Chronic Issues (Still having issues with dizziness and lightheadedness. Saw Cardiology this morning and was told that she more than likely has POTS; they ordered an echocardiogram.) .  Discussed the use of AI scribe software for clinical note transcription with the patient, who gave verbal consent to proceed.  History of Present Illness Kelly Austin is a 23 year old female who presents with dizziness and lightheadedness.  She experiences significant dizziness and lightheadedness, particularly when standing up. Her heart rate increased by 29 beats per minute upon standing. She reported that during an episode of dizziness, her boyfriend stood in front of her because she felt woozy. She reports that her cardiologist plans to perform an echocardiogram in December.  On Thursday, she experienced severe dizziness and chest pressure, prompting her to call an ambulance. Her blood pressure was recorded at 190/80 mmHg during this episode. She has a history of panic attacks and had taken her as-needed medication without relief. She recently restarted Pristiq on Friday to manage her anxiety but reports feeling 'pretty on edge' since then. She is also taking hydroxyzine twice daily and Xanax as needed for anxiety.  She is currently on multiple medications including metoprolol, famotidine , Pristiq, Xanax, ferrous sulfate, hydroxyzine, methylfolate, and meclizine. She is concerned about the number of medications she is taking and their interactions. She has been eating every two to three hours  to prevent hypoglycemia, as she experienced a drop in blood sugar last week.  She has been diagnosed with sleep apnea and uses a CPAP machine, although she sometimes removes it during sleep. She reports morning headaches, excessive tiredness, and waking up gasping for breath. She has had difficulty contacting her CPAP provider for assistance with the device.  She has been experiencing poor sleep, which she attributes to starting Pristiq. She has been taking it around lunchtime but is considering switching to nighttime dosing. She is also concerned about her iron deficiency and is taking an iron supplement every other day to manage this condition.         Social history:  Relevant past medical, surgical, family and social history reviewed and updated as indicated. Interim medical history since our last visit reviewed.  Allergies and medications reviewed and updated.  DATA REVIEWED: CHART IN EPIC     ROS: Negative unless specifically indicated above in HPI.    Current Outpatient Medications:    ALPRAZolam (XANAX) 0.5 MG tablet, , Disp: , Rfl:    desvenlafaxine (PRISTIQ) 25 MG 24 hr tablet, Take 25 mg by mouth daily., Disp: , Rfl:    famotidine  (PEPCID ) 10 MG tablet, Take 1 tablet (10 mg total) by mouth 2 (two) times daily. (Patient taking differently: Take 10 mg by mouth as needed.), Disp: 60 tablet, Rfl: 0   Ferrous Sulfate (IRON SUPPLEMENT PO), every other day., Disp: , Rfl:    hydrOXYzine (ATARAX) 25 MG tablet, Take 25 mg by mouth as needed., Disp: , Rfl:    l-methylfolate-B6-B12 (METANX) 3-35-2 MG TABS tablet, Take 1 tablet by mouth daily., Disp: , Rfl:    meclizine (  ANTIVERT) 12.5 MG tablet, Take one po at bedtime prn vertigo, Disp: 30 tablet, Rfl: 0   metoprolol succinate (TOPROL-XL) 25 MG 24 hr tablet, Take 1 tablet (25 mg total) by mouth daily. Take with or immediately following a meal., Disp: 90 tablet, Rfl: 3        Objective:        BP 114/76 (BP Location: Left  Arm, Patient Position: Sitting, Cuff Size: Large)   Pulse 83   Temp 98.3 F (36.8 C) (Temporal)   Ht 5' 2 (1.575 m)   Wt 231 lb 3.2 oz (104.9 kg)   LMP 07/04/2024 (Exact Date)   SpO2 98%   BMI 42.29 kg/m   Physical Exam VITALS: P- 83, BP- 190/80  Wt Readings from Last 3 Encounters:  07/22/24 231 lb 3.2 oz (104.9 kg)  07/22/24 230 lb (104.3 kg)  07/11/24 230 lb 6.4 oz (104.5 kg)    Physical Exam Vitals reviewed.  Constitutional:      General: She is not in acute distress.    Appearance: Normal appearance. She is obese. She is not ill-appearing, toxic-appearing or diaphoretic.  HENT:     Head: Normocephalic.  Cardiovascular:     Rate and Rhythm: Normal rate and regular rhythm.  Pulmonary:     Effort: Pulmonary effort is normal.  Musculoskeletal:        General: Normal range of motion.  Neurological:     General: No focal deficit present.     Mental Status: She is alert and oriented to person, place, and time. Mental status is at baseline.  Psychiatric:        Mood and Affect: Mood normal.        Behavior: Behavior normal.        Thought Content: Thought content normal.        Judgment: Judgment normal.          Results   Assessment & Plan:   Assessment and Plan Assessment & Plan Suspected postural orthostatic tachycardia syndrome (POTS) with dizziness and lightheadedness Suspected POTS with symptoms of dizziness and lightheadedness. Heart rate increased by 29 beats upon standing, suggestive of POTS. Cardiologist is managing the condition with symptomatic treatment. - Continue metoprolol as prescribed by cardiologist - Ensure adequate hydration - Educate on eating every 2-3 hours to maintain stable blood sugar levels - Provide printout information on POTS management - Schedule echocardiogram in December  Obstructive sleep apnea, mild Mild obstructive sleep apnea with symptoms of morning headaches, excessive tiredness, and waking up gasping for breath.  Difficulty with CPAP adherence due to discomfort and anxiety. - Refer to pulmonologist specializing in sleep apnea for further management options - Encourage continued use of CPAP and follow up with CPAP provider for alternative solutions  Iron deficiency Iron deficiency managed with iron supplementation. She is taking a slow-release iron supplement every other day to minimize gastrointestinal side effects. - Continue slow-release iron supplement every other day - Schedule lab appointment in one month to reassess iron levels  Anxiety and insomnia Anxiety and insomnia potentially exacerbated by recent initiation of Pristiq. She reports feeling on edge but unsure if Pristiq is worsening anxiety. Hydroxyzine and Xanax prescribed for anxiety management. Difficulty sleeping possibly related to Pristiq timing. - Continue Pristiq and monitor for side effects - Consider taking Pristiq at night if it causes insomnia discuss with psychiatry - Continue hydroxyzine and Xanax as needed for anxiety - Discuss medication timing and side effects with psychiatric provider  General Health Maintenance  Discussed nutritional management with upcoming appointment with a nutritionist. Emphasized the importance of maintaining stable blood sugar levels and iron-rich diet. - Attend nutritionist appointment in November - Discuss iron deficiency and dietary needs with nutritionist        Return in about 6 months (around 01/20/2025) for f/u CPE.     Ginger Patrick, MSN, APRN, FNP-C [facility]

## 2024-07-22 NOTE — Patient Instructions (Addendum)
 Medication Instructions:  Your physician recommends the following medication changes.  START TAKING: Metoprolol Succinate (TOPROL XL) 25 mg once daily  Continue all other medications as prescribed  *If you need a refill on your cardiac medications before your next appointment, please call your pharmacy*  Lab Work: No labs ordered today  If you have labs (blood work) drawn today and your tests are completely normal, you will receive your results only by: MyChart Message (if you have MyChart) OR A paper copy in the mail If you have any lab test that is abnormal or we need to change your treatment, we will call you to review the results.  Testing/Procedures: Your physician has requested that you have an echocardiogram. Echocardiography is a painless test that uses sound waves to create images of your heart. It provides your doctor with information about the size and shape of your heart and how well your heart's chambers and valves are working.   You may receive an ultrasound enhancing agent through an IV if needed to better visualize your heart during the echo. This procedure takes approximately one hour.  There are no restrictions for this procedure.  This will take place at [address] [facility] [address], [place] [address]  Please note: We ask at that you not bring children with you during ultrasound (echo/ vascular) testing. Due to room size and safety concerns, children are not allowed in the ultrasound rooms during exams. Our front office staff cannot provide observation of children in our lobby area while testing is being conducted. An adult accompanying a patient to their appointment will only be allowed in the ultrasound room at the discretion of the ultrasound technician under special circumstances. We apologize for any inconvenience.   Follow-Up: At [facility], you and your health needs are our priority.  As part of our continuing mission to provide you  with exceptional heart care, our providers are all part of one team.  This team includes your primary Cardiologist (physician) and Advanced Practice Providers or APPs (Physician Assistants and Nurse Practitioners) who all work together to provide you with the care you need, when you need it.  Your next appointment:   3 month(s)  Provider:   You may see Caron Poser, MD  or one of the following Advanced Practice Providers on your designated Care Team:   Lonni Meager, NP Lesley Maffucci, PA-C Bernardino Bring, PA-C Cadence Mineola, PA-C Tylene Lunch, NP Barnie Hila, NP   We recommend signing up for the patient portal called MyChart.  Sign up information is provided on this After Visit Summary.  MyChart is used to connect with patients for Virtual Visits (Telemedicine).  Patients are able to view lab/test results, encounter notes, upcoming appointments, etc.  Non-urgent messages can be sent to your provider as well.   To learn more about what you can do with MyChart, go to [email or web address].            Guidelines for Patients with POTS  DIETARY MEASURES: The initial treatment strategy includes making sure to get adequate hydration and sodium. - Oral fluid intake should be encouraged to a target of 3 L daily (100oz). - In addition to water, you can choose drinks higher in sodium. Fluids with extra salt include tomato juice, tomato soup or warm broth. Drinks high in electrolytes, such as low calorie G2T, Powerade ZeroT, and PropelT, can also be helpful but be careful to avoid excess sugar. Make water your number one drink. - Eating  smaller meals more often, and avoiding large heavy meals, may be helpful. - Stimulants such as coffee, tea, energy drinks, certain fizzy drinks and alcohol may make symptoms worse, as can refined sugar, carbohydrates and dairy products - keep track of your triggers. - Aim for a daily salt intake of 8-10g of sodium chloride . You can do this in the form  of added salt to food, intake of higher sodium foods, and sports drinks with sodium - Don't over-rely on processed foods. Processed foods are easy to prepare and are appealing when you have reduced energy, but usually have less nutritional value. Beneficial salty snacks may include chicken or beef broth, vegetable broth, pickles, olives, salted fish like sardines/anchovies and nuts. Don't over-rely on snack chips and crackers for salt. - If you are having a hard time getting your salt in by foods, talk to your healthcare provider about sodium tablets. - Diet with high fiber and complex carbohydrates may help reduce blood glucose (sugar) spikes and lessen POTS symptoms. - Drink water and have something salty about 30 minutes before getting out of bed in the morning, if your dizziness is worse in the morning.  COMPRESSION STOCKINGS - Thigh high or waist high compression stockings can be helpful to alleviate the symptoms - seek out ones that are 30-94mmHg on the label. - Some people have tried athletic knee high compression stockings (can be purchased  online or from sporting good stores). These often come in more colors/patterns. Although not as strong as medical compression stockings, these can be effective in helping with symptoms in some people.  EXERCISE -  Studies have shown that one of the most important things a patient with POTS can do is exercise. Many patients with POTS are physically deconditioned and others are at risk for deconditioning by restricting their physical activity to manage POTS symptoms. - Start very slowly, exercise 5 minutes twice per day. Every week increase your exercise time by 3 minutes. Most patients with POTS feel better doing exercises that are recumbent (sitting down, such as using a stationary bike, rowing machine, or swimming).  - Gradually work your way up to a regular exercise program and incorporate walking. - Remember to stay hydrated and cool during your  work-out. - Yoga and pilates have also suggested to be helpful.  REGULAR MOVEMENT - Isometric exercises involve contracting your muscles without actually moving your body. Isometrics squeeze the muscle and push the blood back toward the heart. They are simple to do and can be done lying in bed or seated in a comfortable chair. It's a good idea to do these in bed before getting up to prepare your body for sitting and standing. - Transition slowly with your body. Go from lying to sitting on the edge of the bed. Stay there for several minutes, allowing the body to naturally adjust to the change in position. Once you are standing, pause and wait before walking to allow blood pressure to adjust again. If you feel lightheaded at any point, wait for a few minutes in that position to see if it resolves. If not, then return to the prior position as your body isn't adjusting properly. SLOWLY is the key.  SLEEP - Try to maintain a typical sleep schedule. Go to bed consistently at a certain time and set a consistent time to wake up. The best sleep hygiene and good rest comes from staying consistent with your sleep schedule every day.  - Most people need 7 to 10 hours sleep  at night. - You can try raising the head of your bed 6-10 inches to help alleviate POTS symptoms. The entire bed must be at an angle. Raising the head of the bed will reduce urine formation overnight and increase fluid volume in your circulation in the morning.   BIRTH CONTROL MEDICATIONS - Certain specific birth control pills may exacerbate symptoms - this includes medicines like Yaz or Yasmin that specifically contain drosperinone as the progestin ingredient. Do not stop your medication without talking to the prescriber.   HELPFUL EXERCISE PROGRAM Microsoft Word - [email or web address]_POTS_Exercise_Program.docx  POTSIE APP POTSie - An App for POTS Syndrome

## 2024-07-23 NOTE — Telephone Encounter (Signed)
 It was awaiting review and scheduling by [facility].   Patient has since had an appt on 07/22/24 with Dr Argentina

## 2024-07-24 NOTE — Telephone Encounter (Signed)
 Great thank you!

## 2024-07-25 ENCOUNTER — Encounter: Payer: Self-pay | Admitting: *Deleted

## 2024-07-26 NOTE — Telephone Encounter (Signed)
 Can you get me a  copy of that DMV evaluation form?

## 2024-07-31 NOTE — Progress Notes (Signed)
 Medical Nutrition Therapy  Appointment Start time:  1400  Appointment End time: 1510  Primary concerns today: weight management  Referral diagnosis: Morbid (severe) obesity due to excess calories, Mild OSA Preferred learning style:  no preference indicated Learning readiness: ready- change in progress   NUTRITION ASSESSMENT    Clinical Medical Hx:  Past Medical History:  Diagnosis Date   Anxiety    Depression    Sleep apnea 06/03/2024    Medications:  Current Outpatient Medications:    ALPRAZolam (XANAX) 0.5 MG tablet, , Disp: , Rfl:    desvenlafaxine (PRISTIQ) 25 MG 24 hr tablet, Take 25 mg by mouth daily., Disp: , Rfl:    Ferrous Sulfate (IRON SUPPLEMENT PO), every other day., Disp: , Rfl:    hydrOXYzine (ATARAX) 25 MG tablet, Take 25 mg by mouth as needed., Disp: , Rfl:    meclizine (ANTIVERT) 12.5 MG tablet, Take one po at bedtime prn vertigo, Disp: 30 tablet, Rfl: 0   metoprolol succinate (TOPROL-XL) 25 MG 24 hr tablet, Take 1 tablet (25 mg total) by mouth daily. Take with or immediately following a meal. (Patient taking differently: Take 25 mg by mouth daily. Take with or immediately following a meal.), Disp: 90 tablet, Rfl: 3   famotidine  (PEPCID ) 10 MG tablet, Take 1 tablet (10 mg total) by mouth 2 (two) times daily. (Patient not taking: Reported on 08/12/2024), Disp: 60 tablet, Rfl: 0   l-methylfolate-B6-B12 (METANX) 3-35-2 MG TABS tablet, Take 1 tablet by mouth daily., Disp: , Rfl:    Labs:  Lab Results  Component Value Date   CHOL 179 04/25/2022   HDL 43.90 04/25/2022   LDLCALC 123 (H) 04/25/2022   TRIG 64.0 04/25/2022   CHOLHDL 4 04/25/2022   No results found for: HGBA1C  Notable Signs/Symptoms:  Wt Readings from Last 3 Encounters:  07/22/24 231 lb 3.2 oz (104.9 kg)  07/22/24 230 lb (104.3 kg)  07/11/24 230 lb 6.4 oz (104.5 kg)   Lifestyle & Dietary Hx Pt presents today alone. Pt reports she is going to school part time. Pt c/o depression and states she  is under the care of therapy. Pt states she may have POTS. Pt desires to decrease weight to a lower BMI range. Pt has tried a calorie deficit experience weight reductions that was followed by rebound weight gain. Pt reports she has tried tracking her calorie intake stating ~2000 kcals daily. Pt reports attempting try intermittent fasting and states experiences symptoms of shaky and d/c this pattern of eating.   Pt reports she aims to eat every two hours and states this challenging and intake occurs every ~3 hours.  Pt reports pair carbohydrates and protein at meals and snacks. Pt reports her doctor told her she may be experiencing hypoglycemia. Pt reports purchasing a glucometer and has not yet set it up. Pt reports physical activity decreased about 4 weeks ago d/t dizziness. Pt reports when she feels dizzy or shaky she will sit down and eat something sweeter I.e half a can of soda or brownie and feels better. Pt reports typical intake is typical intake of 2 meals with snack for lunch. Pt reports she weights herself at home every two weeks. All Pt's questions were answered during this encounter.   Estimated daily fluid intake: 100 oz daily Supplements: none Sleep: good with CPAP on average 8 hours Stress / self-care: 7 out of 10 /self care includes: prayer, walking Current average weekly physical activity: walking 2/d/w for 20 minutes   24-Hr  Dietary Recall First Meal: ~9-11 am: 1 packet flavored oatmeal, grapes, water with electrolyte packet or chia pudding made with almond milk and plain greek yogurt  Snack: none Second Meal: ~12-3 pm: ham, pepperoni sub on italian herb and cheese with lettuce, onion,bell  and banana peppers, water Snack: none or chewy chocolate chip granola bar  Third Meal: ~5-9 pm: grill chicken, 2 tortilla flour, bell peppers, onions, lettuce, sour cream, ~ 1 cup of mexican rice, water  Snack: pineapple cake or turkey slices occasionally  Beverages: water, electrolyte  packet, ollie pop, diet coke  NUTRITION DIAGNOSIS  NB-1.1 Food and nutrition-related knowledge deficit As related to no prior nutrition related education.  As evidenced by Pt report and dietary recall .   NUTRITION INTERVENTION  Nutrition education (E-1) on the following topics:  Fruits & Vegetables: Aim to fill half your plate with a variety of fruits and vegetables. They are rich in vitamins, minerals, and fiber, and can help reduce the risk of chronic diseases. Choose a colorful assortment of fruits and vegetables to ensure you get a wide range of nutrients. Grains and Starches: Make at least half of your grain choices whole grains, such as brown rice, whole wheat bread, and oats. Whole grains provide fiber, which aids in digestion and healthy cholesterol levels. Aim for whole forms of starchy vegetables such as potatoes, sweet potatoes, beans, peas, and corn, which are fiber rich and provide many vitamins and minerals.  Protein: Incorporate lean sources of protein, such as poultry, fish, beans, nuts, and seeds, into your meals. Protein is essential for building and repairing tissues, staying full, balancing blood sugar, as well as supporting immune function. Dairy: Include low-fat or fat-free dairy products like milk, yogurt, and cheese in your diet. Dairy foods are excellent sources of calcium and vitamin D, which are crucial for bone health.  Physical Activity: Aim for 60 minutes of physical activity daily. Regular physical activity promotes overall health-including helping to reduce risk for heart disease and diabetes, promoting mental health, and helping us  sleep better.    Handouts Provided Include  Plate Planner-[employer]  Move Your way-[facility]  Learning Style & Readiness for Change Teaching method utilized: Visual & Auditory  Demonstrated degree of understanding via: Teach Back  Barriers to learning/adherence to lifestyle change: time management  Goals Established by Pt Aim for balanced  meal and snacks on a schedule Carry a fast acting sugar source with you at all time  MONITORING & EVALUATION Dietary intake, weekly physical activity  Next Steps  Patient is to return PRN.

## 2024-07-31 NOTE — Telephone Encounter (Signed)
 Pt was seen 07/22/24 by CVD [place] Dr Argentina  Appt notes are in Epic

## 2024-08-12 ENCOUNTER — Encounter: Attending: Family | Admitting: Dietician

## 2024-08-12 DIAGNOSIS — Z713 Dietary counseling and surveillance: Secondary | ICD-10-CM | POA: Diagnosis present

## 2024-08-12 DIAGNOSIS — G4733 Obstructive sleep apnea (adult) (pediatric): Secondary | ICD-10-CM | POA: Insufficient documentation

## 2024-08-12 NOTE — Patient Instructions (Signed)
 Aim for balanced meal and snacks on a schedule

## 2024-08-21 ENCOUNTER — Ambulatory Visit: Payer: Self-pay | Admitting: Family

## 2024-08-21 ENCOUNTER — Other Ambulatory Visit

## 2024-08-21 DIAGNOSIS — D500 Iron deficiency anemia secondary to blood loss (chronic): Secondary | ICD-10-CM | POA: Diagnosis not present

## 2024-08-21 LAB — CBC
HCT: 38.5 % (ref 36.0–46.0)
Hemoglobin: 12.4 g/dL (ref 12.0–15.0)
MCHC: 32.2 g/dL (ref 30.0–36.0)
MCV: 80.2 fl (ref 78.0–100.0)
Platelets: 300 K/uL (ref 150.0–400.0)
RBC: 4.81 Mil/uL (ref 3.87–5.11)
RDW: 14.5 % (ref 11.5–15.5)
WBC: 11.6 K/uL — ABNORMAL HIGH (ref 4.0–10.5)

## 2024-08-21 LAB — IBC + FERRITIN
Ferritin: 10.4 ng/mL (ref 10.0–291.0)
Iron: 49 ug/dL (ref 42–145)
Saturation Ratios: 11.6 % — ABNORMAL LOW (ref 20.0–50.0)
TIBC: 422.8 ug/dL (ref 250.0–450.0)
Transferrin: 302 mg/dL (ref 212.0–360.0)

## 2024-08-22 ENCOUNTER — Other Ambulatory Visit

## 2024-09-04 ENCOUNTER — Ambulatory Visit

## 2024-09-05 NOTE — Progress Notes (Deleted)
 Medical Nutrition Therapy  Appointment Start time:  *** Appointment End time: ***  Primary concerns today: weight management  Referral diagnosis: Morbid (severe) obesity due to excess calories, Mild OSA Preferred learning style:  no preference indicated Learning readiness: ready- change in progress   NUTRITION ASSESSMENT    Clinical Medical Hx:  Past Medical History:  Diagnosis Date   Anxiety    Depression    Sleep apnea 06/03/2024    Medications:  Current Outpatient Medications:    ALPRAZolam (XANAX) 0.5 MG tablet, , Disp: , Rfl:    desvenlafaxine (PRISTIQ) 25 MG 24 hr tablet, Take 25 mg by mouth daily., Disp: , Rfl:    famotidine  (PEPCID ) 10 MG tablet, Take 1 tablet (10 mg total) by mouth 2 (two) times daily. (Patient not taking: Reported on 08/12/2024), Disp: 60 tablet, Rfl: 0   Ferrous Sulfate (IRON SUPPLEMENT PO), every other day., Disp: , Rfl:    hydrOXYzine (ATARAX) 25 MG tablet, Take 25 mg by mouth as needed., Disp: , Rfl:    l-methylfolate-B6-B12 (METANX) 3-35-2 MG TABS tablet, Take 1 tablet by mouth daily., Disp: , Rfl:    meclizine  (ANTIVERT ) 12.5 MG tablet, Take one po at bedtime prn vertigo, Disp: 30 tablet, Rfl: 0   metoprolol  succinate (TOPROL -XL) 25 MG 24 hr tablet, Take 1 tablet (25 mg total) by mouth daily. Take with or immediately following a meal. (Patient taking differently: Take 25 mg by mouth daily. Take with or immediately following a meal.), Disp: 90 tablet, Rfl: 3   Labs:  Lab Results  Component Value Date   CHOL 179 04/25/2022   HDL 43.90 04/25/2022   LDLCALC 123 (H) 04/25/2022   TRIG 64.0 04/25/2022   CHOLHDL 4 04/25/2022   No results found for: HGBA1C  Notable Signs/Symptoms:  Wt Readings from Last 3 Encounters:  07/22/24 231 lb 3.2 oz (104.9 kg)  07/22/24 230 lb (104.3 kg)  07/11/24 230 lb 6.4 oz (104.5 kg)   Lifestyle & Dietary Hx     11/10: Pt presents today alone. Pt reports she is going to school part time. Pt c/o depression and  states she is under the care of therapy. Pt states she may have POTS. Pt desires to decrease weight to a lower BMI range. Pt has tried a calorie deficit experience weight reductions that was followed by rebound weight gain. Pt reports she has tried tracking her calorie intake stating ~2000 kcals daily. Pt reports attempting try intermittent fasting and states experiences symptoms of shaky and d/c this pattern of eating.   Pt reports she aims to eat every two hours and states this challenging and intake occurs every ~3 hours.  Pt reports pair carbohydrates and protein at meals and snacks. Pt reports her doctor told her she may be experiencing hypoglycemia. Pt reports purchasing a glucometer and has not yet set it up. Pt reports physical activity decreased about 4 weeks ago d/t dizziness. Pt reports when she feels dizzy or shaky she will sit down and eat something sweeter I.e half a can of soda or brownie and feels better. Pt reports typical intake is typical intake of 2 meals with snack for lunch. Pt reports she weights herself at home every two weeks. All Pt's questions were answered during this encounter.   Estimated daily fluid intake: 100 oz daily Supplements: none Sleep: good with CPAP on average 8 hours Stress / self-care: 7 out of 10 /self care includes: prayer, walking Current average weekly physical activity: walking 2/d/w for 20  minutes   24-Hr Dietary Recall First Meal: ~9-11 am: 1 packet flavored oatmeal, grapes, water with electrolyte packet or chia pudding made with almond milk and plain greek yogurt  Snack: none Second Meal: ~12-3 pm: ham, pepperoni sub on italian herb and cheese with lettuce, onion,bell  and banana peppers, water Snack: none or chewy chocolate chip granola bar  Third Meal: ~5-9 pm: grill chicken, 2 tortilla flour, bell peppers, onions, lettuce, sour cream, ~ 1 cup of mexican rice, water  Snack: pineapple cake or turkey slices occasionally  Beverages: water,  electrolyte packet, ollie pop, diet coke  NUTRITION DIAGNOSIS  NB-1.1 Food and nutrition-related knowledge deficit As related to no prior nutrition related education.  As evidenced by Pt report and dietary recall .   NUTRITION INTERVENTION  Nutrition education (E-1) on the following topics:  Fruits & Vegetables: Aim to fill half your plate with a variety of fruits and vegetables. They are rich in vitamins, minerals, and fiber, and can help reduce the risk of chronic diseases. Choose a colorful assortment of fruits and vegetables to ensure you get a wide range of nutrients. Grains and Starches: Make at least half of your grain choices whole grains, such as brown rice, whole wheat bread, and oats. Whole grains provide fiber, which aids in digestion and healthy cholesterol levels. Aim for whole forms of starchy vegetables such as potatoes, sweet potatoes, beans, peas, and corn, which are fiber rich and provide many vitamins and minerals.  Protein: Incorporate lean sources of protein, such as poultry, fish, beans, nuts, and seeds, into your meals. Protein is essential for building and repairing tissues, staying full, balancing blood sugar, as well as supporting immune function. Dairy: Include low-fat or fat-free dairy products like milk, yogurt, and cheese in your diet. Dairy foods are excellent sources of calcium and vitamin D, which are crucial for bone health.  Physical Activity: Aim for 60 minutes of physical activity daily. Regular physical activity promotes overall health-including helping to reduce risk for heart disease and diabetes, promoting mental health, and helping us  sleep better.    Handouts Provided Include  Plate Planner-[employer]  Move Your way-[facility]  Learning Style & Readiness for Change Teaching method utilized: Visual & Auditory  Demonstrated degree of understanding via: Teach Back  Barriers to learning/adherence to lifestyle change: time management  Goals Established by Pt Aim  for balanced meal and snacks on a schedule Carry a fast acting sugar source with you at all time  MONITORING & EVALUATION Dietary intake, weekly physical activity  Next Steps  Patient is to return PRN.

## 2024-09-09 ENCOUNTER — Encounter: Admitting: Dietician

## 2024-09-09 DIAGNOSIS — G4733 Obstructive sleep apnea (adult) (pediatric): Secondary | ICD-10-CM

## 2024-09-30 NOTE — Progress Notes (Signed)
 Medical Nutrition Therapy  Appointment Start time:  1400 Appointment End time: 1441  Primary concerns today: weight management  Referral diagnosis: Morbid (severe) obesity due to excess calories, Mild OSA Preferred learning style:  no preference indicated Learning readiness: ready- change in progress   NUTRITION ASSESSMENT  Clinical Medical Hx:  Past Medical History:  Diagnosis Date   Anxiety    Depression    Sleep apnea 06/03/2024    Medications:  Current Outpatient Medications:    ALPRAZolam (XANAX) 0.5 MG tablet, , Disp: , Rfl:    desvenlafaxine (PRISTIQ) 25 MG 24 hr tablet, Take 25 mg by mouth daily., Disp: , Rfl:    famotidine  (PEPCID ) 10 MG tablet, Take 1 tablet (10 mg total) by mouth 2 (two) times daily. (Patient not taking: Reported on 08/12/2024), Disp: 60 tablet, Rfl: 0   Ferrous Sulfate (IRON SUPPLEMENT PO), every other day., Disp: , Rfl:    hydrOXYzine (ATARAX) 25 MG tablet, Take 25 mg by mouth as needed., Disp: , Rfl:    l-methylfolate-B6-B12 (METANX) 3-35-2 MG TABS tablet, Take 1 tablet by mouth daily., Disp: , Rfl:    meclizine  (ANTIVERT ) 12.5 MG tablet, Take one po at bedtime prn vertigo, Disp: 30 tablet, Rfl: 0   metoprolol  succinate (TOPROL -XL) 25 MG 24 hr tablet, Take 1 tablet (25 mg total) by mouth daily. Take with or immediately following a meal. (Patient taking differently: Take 25 mg by mouth daily. Take with or immediately following a meal.), Disp: 90 tablet, Rfl: 3   Labs:  Lab Results  Component Value Date   CHOL 179 04/25/2022   HDL 43.90 04/25/2022   LDLCALC 123 (H) 04/25/2022   TRIG 64.0 04/25/2022   CHOLHDL 4 04/25/2022   No results found for: HGBA1C  Notable Signs/Symptoms:  Wt Readings from Last 3 Encounters:  07/22/24 231 lb 3.2 oz (104.9 kg)  07/22/24 230 lb (104.3 kg)  07/11/24 230 lb 6.4 oz (104.5 kg)    Lifestyle & Dietary Hx Pt presents today alone for follow up today. Pt reports she is participating in physical activity aiming  for daily. 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  Pt reports she has tried tracking her calorie intake stating ~1400 kcals daily.  Pt reports she is testing her blood sugar with symptom development of shaky and lightheaded with the lowest value of 80 mg/dL. Pt reports she aims to eat every three hours and states her challenging is now balanced meals and snacks.  Pt reports typical intake is typical intake of 3 meals daily with a bedtime snack. All Pt's questions were answered during this encounter.   Estimated daily fluid intake: 100 oz daily Supplements: none Sleep: good with CPAP on average 8 hours Stress / self-care: 9 out of 10 /self care includes: prayer, walking, therapy Current average weekly physical activity: walking 3-4/d/w for 30-45 minutes   24-Hr Dietary Recall First Meal: protein shake  Snack: none Second Meal: chicken, pasta shells, sun dried tomatoes, celery, onion, cheese, water Snack: none Third Meal: steak, asparagus, potatoes, water Snack: chocolate or 2-6 handfuls popcorn or  Beverages: water, electrolyte packet, ollie pop, diet coke, sparkling water  NUTRITION DIAGNOSIS  NB-1.1 Food and nutrition-related knowledge deficit As related to no prior nutrition related education.  As evidenced by Pt report and dietary recall .   NUTRITION INTERVENTION  Nutrition education (E-1) on the following topics:  Fruits & Vegetables: Aim to fill half your plate with a variety of fruits and vegetables. They  are rich in vitamins, minerals, and fiber, and can help reduce the risk of chronic diseases. Choose a colorful assortment of fruits and vegetables to ensure you get a wide range of nutrients. Grains and Starches: Make at least half of your grain choices whole grains, such as brown rice, whole wheat bread, and oats. Whole grains provide fiber, which aids in digestion and healthy cholesterol levels. Aim for whole forms of starchy vegetables such as potatoes, sweet  potatoes, beans, peas, and corn, which are fiber rich and provide many vitamins and minerals.  Protein: Incorporate lean sources of protein, such as poultry, fish, beans, nuts, and seeds, into your meals. Protein is essential for building and repairing tissues, staying full, balancing blood sugar, as well as supporting immune function. Dairy: Include low-fat or fat-free dairy products like milk, yogurt, and cheese in your diet. Dairy foods are excellent sources of calcium and vitamin D, which are crucial for bone health.  Physical Activity: Aim for 60 minutes of physical activity daily. Regular physical activity promotes overall health-including helping to reduce risk for heart disease and diabetes, promoting mental health, and helping us  sleep better.    Handouts Provided Include  Carbohydrate controlled snack options [employer] Teaching method utilized: Visual & Auditory  Demonstrated degree of understanding via: Teach Back  Barriers to learning/adherence to lifestyle change: time management, stress  Goals Established by Pt Aim for balanced meal and snacks on a schedule Increase physical activity of resistance weight training aiming for 1-2/d/w  MONITORING & EVALUATION Dietary intake, weekly physical activity  Next Steps  Patient is to return PRN.

## 2024-10-07 ENCOUNTER — Encounter: Payer: Self-pay | Admitting: Dietician

## 2024-10-07 ENCOUNTER — Encounter: Attending: Family | Admitting: Dietician

## 2024-10-07 DIAGNOSIS — G4733 Obstructive sleep apnea (adult) (pediatric): Secondary | ICD-10-CM | POA: Diagnosis present

## 2024-10-07 NOTE — Patient Instructions (Signed)
 Aim for balanced meal and snacks on a schedule Increase physical activity of resistance weight training aiming for 1-2/d/w

## 2024-10-09 ENCOUNTER — Ambulatory Visit

## 2024-10-09 DIAGNOSIS — R002 Palpitations: Secondary | ICD-10-CM

## 2024-10-09 DIAGNOSIS — I479 Paroxysmal tachycardia, unspecified: Secondary | ICD-10-CM | POA: Diagnosis not present

## 2024-10-09 DIAGNOSIS — R42 Dizziness and giddiness: Secondary | ICD-10-CM

## 2024-10-09 LAB — ECHOCARDIOGRAM COMPLETE
AR max vel: 1.93 cm2
AV Area VTI: 2.1 cm2
AV Area mean vel: 1.97 cm2
AV Mean grad: 4 mmHg
AV Peak grad: 7.7 mmHg
Ao pk vel: 1.39 m/s
Area-P 1/2: 3.99 cm2
S' Lateral: 2.82 cm

## 2024-10-10 ENCOUNTER — Ambulatory Visit: Payer: Self-pay

## 2024-10-21 NOTE — Progress Notes (Unsigned)
" °  Cardiology Office Note   Date:  10/22/2024  ID:  SEREN CHALOUX, DOB Dec 20, 2000, MRN [id number] PCP: Corwin Antu, FNP  [facility] Providers Cardiologist:  Caron Poser, MD     History of Present Illness Kelly Austin is a 24 y.o. female PMH obesity who presents for further evaluation and management of dizziness and lightheadedness.  Patient presents with a 2 to 2-month history of dizziness with standing.  She was recently trialed on meclizine .  This has not helped much.  She notes that she feels lightheaded and almost every time she stands up.  No syncope.  She also has palpitations.  No high risk family history.  Recent CBC, TSH, BMP all within normal limits.  Interval history: Since last visit, we obtained an echocardiogram which was normal.  Still having some postural dizziness, but this does seem to be a little better than last time however since she stopped taking Pristiq.  No syncopal episodes.  Relevant CVD History -TTE 10/2024 normal biventricular function, no significant valvular disease -Monitor 05/2024 mean heart rate 69 bpm, rare ectopy, 6 beat episode of NSVT.  Triggers corresponded to sinus rhythm and occasionally PACs. - TTE 08/2022 normal biventricular function and no valvular disease   ROS: Pt denies any chest discomfort, jaw pain, arm pain, syncope, presyncope, orthopnea, PND, or LE edema.  Studies Reviewed I have independently reviewed the patient's ECG, previous cardiac testing, recent medical records.  Physical Exam VS:  BP 110/76 (BP Location: Left Arm, Patient Position: Sitting, Cuff Size: Large)   Pulse 77   Ht 5' 2 (1.575 m)   Wt 233 lb (105.7 kg)   SpO2 97%   BMI 42.62 kg/m   Orthostatic VS for the past 24 hrs (Last 3 readings):  BP- Lying Pulse- Lying BP- Sitting Pulse- Sitting BP- Standing at 0 minutes Pulse- Standing at 0 minutes BP- Standing at 3 minutes Pulse- Standing at 3 minutes  10/22/24 1401 107/71 74 117/82 84 118/83  82 116/82 99       Wt Readings from Last 3 Encounters:  10/22/24 233 lb (105.7 kg)  07/22/24 231 lb 3.2 oz (104.9 kg)  07/22/24 230 lb (104.3 kg)    GEN: No acute distress. NECK: No JVD; No carotid bruits. CARDIAC: RRR, no murmurs, rubs, gallops. RESPIRATORY:  Clear to auscultation. EXTREMITIES:  Warm and well-perfused. No edema.  ASSESSMENT AND PLAN POTS Paroxysmal tachycardia Palpitations Dizziness PACs Most consistent with POTS.  We did orthostatic vital signs at last visit which showed a 29 bpm increase in heart rate with standing. No orthostatic hypotension. Recent monitor was significant only for some occasional PACs which did sometimes correspond with triggers; no IST.  Echocardiogram showed a structurally normal heart.  No high risk signs on history or exam.  Plan: - At this point, symptoms are most consistent with POTS.  Recommend continued conservative treatment such as liberalizing salt and fluid intake, graduated exercise program (e.g., recumbent cycling, swimming, etc.); included in AVS at last visit - Can trial low doses of beta-blocker to see if this helps; she says her psychiatrist recently prescribed propranolol.  I think this would be perfectly fine to trial.        Dispo: RTC 6 months or sooner as needed  Signed, Caron Poser, MD  "

## 2024-10-22 ENCOUNTER — Ambulatory Visit

## 2024-10-22 VITALS — BP 110/76 | HR 77 | Ht 62.0 in | Wt 233.0 lb

## 2024-10-22 DIAGNOSIS — I479 Paroxysmal tachycardia, unspecified: Secondary | ICD-10-CM | POA: Diagnosis not present

## 2024-10-22 DIAGNOSIS — I491 Atrial premature depolarization: Secondary | ICD-10-CM | POA: Diagnosis not present

## 2024-10-22 DIAGNOSIS — R42 Dizziness and giddiness: Secondary | ICD-10-CM

## 2024-10-22 DIAGNOSIS — R002 Palpitations: Secondary | ICD-10-CM | POA: Diagnosis not present

## 2024-10-22 DIAGNOSIS — G90A Postural orthostatic tachycardia syndrome (POTS): Secondary | ICD-10-CM | POA: Diagnosis not present

## 2024-10-22 NOTE — Patient Instructions (Signed)
 Medication Instructions:   Your physician recommends that you continue on your current medications as directed. Please refer to the Current Medication list given to you today.  *If you need a refill on your cardiac medications before your next appointment, please call your pharmacy*  Lab Work:  No labs ordered today   If you have labs (blood work) drawn today and your tests are completely normal, you will receive your results only by: MyChart Message (if you have MyChart) OR A paper copy in the mail If you have any lab test that is abnormal or we need to change your treatment, we will call you to review the results.  Testing/Procedures:  No test ordered today   Follow-Up: At [facility], you and your health needs are our priority.  As part of our continuing mission to provide you with exceptional heart care, our providers are all part of one team.  This team includes your primary Cardiologist (physician) and Advanced Practice Providers or APPs (Physician Assistants and Nurse Practitioners) who all work together to provide you with the care you need, when you need it.  Your next appointment:   6 month(s)  Provider:   You may see Caron Poser, MD or one of the following Advanced Practice Providers on your designated Care Team:   Lonni Meager, NP Lesley Maffucci, PA-C Bernardino Bring, PA-C Cadence [place], PA-C Tylene Lunch, NP Barnie Hila, NP    We recommend signing up for the patient portal called MyChart.  Sign up information is provided on this After Visit Summary.  MyChart is used to connect with patients for Virtual Visits (Telemedicine).  Patients are able to view lab/test results, encounter notes, upcoming appointments, etc.  Non-urgent messages can be sent to your provider as well.   To learn more about what you can do with MyChart, go to [email or web address].

## 2025-01-20 ENCOUNTER — Encounter: Admitting: Family

## 2025-04-01 ENCOUNTER — Ambulatory Visit: Admitting: Student
